# Patient Record
Sex: Male | Born: 1935 | Race: White | Hispanic: No | Smoking: Former smoker
Health system: Southern US, Community
[De-identification: ages and names within clinical notes are randomized; demographics above are authoritative.]

## PROBLEM LIST (undated history)

## (undated) DIAGNOSIS — I10 Essential (primary) hypertension: Secondary | ICD-10-CM

## (undated) HISTORY — PX: NASAL SEPTUM SURGERY: SHX37

## (undated) HISTORY — PX: KIDNEY STONE SURGERY: SHX686

## (undated) HISTORY — PX: TONSILLECTOMY: SUR1361

---

## 2015-02-23 DIAGNOSIS — M170 Bilateral primary osteoarthritis of knee: Secondary | ICD-10-CM | POA: Diagnosis not present

## 2015-02-23 DIAGNOSIS — Z6825 Body mass index (BMI) 25.0-25.9, adult: Secondary | ICD-10-CM | POA: Diagnosis not present

## 2015-02-23 DIAGNOSIS — I10 Essential (primary) hypertension: Secondary | ICD-10-CM | POA: Diagnosis not present

## 2015-05-30 DIAGNOSIS — Z1389 Encounter for screening for other disorder: Secondary | ICD-10-CM | POA: Diagnosis not present

## 2015-05-30 DIAGNOSIS — Z6825 Body mass index (BMI) 25.0-25.9, adult: Secondary | ICD-10-CM | POA: Diagnosis not present

## 2015-05-30 DIAGNOSIS — Z0000 Encounter for general adult medical examination without abnormal findings: Secondary | ICD-10-CM | POA: Diagnosis not present

## 2015-05-30 DIAGNOSIS — Z79899 Other long term (current) drug therapy: Secondary | ICD-10-CM | POA: Diagnosis not present

## 2015-05-30 DIAGNOSIS — I10 Essential (primary) hypertension: Secondary | ICD-10-CM | POA: Diagnosis not present

## 2015-05-30 DIAGNOSIS — G3184 Mild cognitive impairment, so stated: Secondary | ICD-10-CM | POA: Diagnosis not present

## 2015-08-15 DIAGNOSIS — J3089 Other allergic rhinitis: Secondary | ICD-10-CM | POA: Diagnosis not present

## 2015-08-15 DIAGNOSIS — G3184 Mild cognitive impairment, so stated: Secondary | ICD-10-CM | POA: Diagnosis not present

## 2015-08-15 DIAGNOSIS — Z6825 Body mass index (BMI) 25.0-25.9, adult: Secondary | ICD-10-CM | POA: Diagnosis not present

## 2015-08-15 DIAGNOSIS — I10 Essential (primary) hypertension: Secondary | ICD-10-CM | POA: Diagnosis not present

## 2015-08-15 DIAGNOSIS — L02415 Cutaneous abscess of right lower limb: Secondary | ICD-10-CM | POA: Diagnosis not present

## 2015-11-16 DIAGNOSIS — I10 Essential (primary) hypertension: Secondary | ICD-10-CM | POA: Diagnosis not present

## 2015-11-16 DIAGNOSIS — J3089 Other allergic rhinitis: Secondary | ICD-10-CM | POA: Diagnosis not present

## 2015-11-16 DIAGNOSIS — G3184 Mild cognitive impairment, so stated: Secondary | ICD-10-CM | POA: Diagnosis not present

## 2015-11-16 DIAGNOSIS — Z6825 Body mass index (BMI) 25.0-25.9, adult: Secondary | ICD-10-CM | POA: Diagnosis not present

## 2016-06-04 DIAGNOSIS — Z0000 Encounter for general adult medical examination without abnormal findings: Secondary | ICD-10-CM | POA: Diagnosis not present

## 2016-06-04 DIAGNOSIS — G3184 Mild cognitive impairment, so stated: Secondary | ICD-10-CM | POA: Diagnosis not present

## 2016-06-04 DIAGNOSIS — Z1389 Encounter for screening for other disorder: Secondary | ICD-10-CM | POA: Diagnosis not present

## 2016-06-04 DIAGNOSIS — I10 Essential (primary) hypertension: Secondary | ICD-10-CM | POA: Diagnosis not present

## 2016-06-04 DIAGNOSIS — E784 Other hyperlipidemia: Secondary | ICD-10-CM | POA: Diagnosis not present

## 2016-08-05 DIAGNOSIS — E784 Other hyperlipidemia: Secondary | ICD-10-CM | POA: Diagnosis not present

## 2016-08-05 DIAGNOSIS — Z79899 Other long term (current) drug therapy: Secondary | ICD-10-CM | POA: Diagnosis not present

## 2016-08-05 DIAGNOSIS — E860 Dehydration: Secondary | ICD-10-CM | POA: Diagnosis not present

## 2016-08-05 DIAGNOSIS — A681 Tick-borne relapsing fever: Secondary | ICD-10-CM | POA: Diagnosis not present

## 2016-08-05 DIAGNOSIS — I10 Essential (primary) hypertension: Secondary | ICD-10-CM | POA: Diagnosis not present

## 2016-08-12 DIAGNOSIS — R1084 Generalized abdominal pain: Secondary | ICD-10-CM | POA: Diagnosis not present

## 2016-08-12 DIAGNOSIS — R932 Abnormal findings on diagnostic imaging of liver and biliary tract: Secondary | ICD-10-CM | POA: Diagnosis not present

## 2016-08-12 DIAGNOSIS — R161 Splenomegaly, not elsewhere classified: Secondary | ICD-10-CM | POA: Diagnosis not present

## 2016-08-12 DIAGNOSIS — Z87442 Personal history of urinary calculi: Secondary | ICD-10-CM | POA: Diagnosis not present

## 2016-08-12 DIAGNOSIS — R109 Unspecified abdominal pain: Secondary | ICD-10-CM | POA: Diagnosis not present

## 2016-09-03 DIAGNOSIS — M899 Disorder of bone, unspecified: Secondary | ICD-10-CM | POA: Diagnosis not present

## 2016-09-03 DIAGNOSIS — I728 Aneurysm of other specified arteries: Secondary | ICD-10-CM | POA: Diagnosis not present

## 2016-09-03 DIAGNOSIS — R1084 Generalized abdominal pain: Secondary | ICD-10-CM | POA: Diagnosis not present

## 2016-09-03 DIAGNOSIS — R161 Splenomegaly, not elsewhere classified: Secondary | ICD-10-CM | POA: Diagnosis not present

## 2016-09-03 DIAGNOSIS — R932 Abnormal findings on diagnostic imaging of liver and biliary tract: Secondary | ICD-10-CM | POA: Diagnosis not present

## 2016-09-12 DIAGNOSIS — N182 Chronic kidney disease, stage 2 (mild): Secondary | ICD-10-CM | POA: Diagnosis not present

## 2016-09-12 DIAGNOSIS — E782 Mixed hyperlipidemia: Secondary | ICD-10-CM | POA: Diagnosis not present

## 2016-09-12 DIAGNOSIS — J3089 Other allergic rhinitis: Secondary | ICD-10-CM | POA: Diagnosis not present

## 2016-09-12 DIAGNOSIS — J440 Chronic obstructive pulmonary disease with acute lower respiratory infection: Secondary | ICD-10-CM | POA: Diagnosis not present

## 2016-09-12 DIAGNOSIS — Z6825 Body mass index (BMI) 25.0-25.9, adult: Secondary | ICD-10-CM | POA: Diagnosis not present

## 2016-12-12 DIAGNOSIS — J3089 Other allergic rhinitis: Secondary | ICD-10-CM | POA: Diagnosis not present

## 2016-12-12 DIAGNOSIS — E782 Mixed hyperlipidemia: Secondary | ICD-10-CM | POA: Diagnosis not present

## 2016-12-12 DIAGNOSIS — Z6825 Body mass index (BMI) 25.0-25.9, adult: Secondary | ICD-10-CM | POA: Diagnosis not present

## 2016-12-12 DIAGNOSIS — J440 Chronic obstructive pulmonary disease with acute lower respiratory infection: Secondary | ICD-10-CM | POA: Diagnosis not present

## 2016-12-12 DIAGNOSIS — N182 Chronic kidney disease, stage 2 (mild): Secondary | ICD-10-CM | POA: Diagnosis not present

## 2017-10-10 DIAGNOSIS — M164 Bilateral post-traumatic osteoarthritis of hip: Secondary | ICD-10-CM | POA: Diagnosis not present

## 2017-10-10 DIAGNOSIS — I10 Essential (primary) hypertension: Secondary | ICD-10-CM | POA: Diagnosis not present

## 2017-10-10 DIAGNOSIS — L570 Actinic keratosis: Secondary | ICD-10-CM | POA: Diagnosis not present

## 2017-10-10 DIAGNOSIS — E782 Mixed hyperlipidemia: Secondary | ICD-10-CM | POA: Diagnosis not present

## 2017-10-10 DIAGNOSIS — N182 Chronic kidney disease, stage 2 (mild): Secondary | ICD-10-CM | POA: Diagnosis not present

## 2017-10-10 DIAGNOSIS — Z6822 Body mass index (BMI) 22.0-22.9, adult: Secondary | ICD-10-CM | POA: Diagnosis not present

## 2018-01-14 DIAGNOSIS — Z1389 Encounter for screening for other disorder: Secondary | ICD-10-CM | POA: Diagnosis not present

## 2018-01-14 DIAGNOSIS — E782 Mixed hyperlipidemia: Secondary | ICD-10-CM | POA: Diagnosis not present

## 2018-01-14 DIAGNOSIS — N182 Chronic kidney disease, stage 2 (mild): Secondary | ICD-10-CM | POA: Diagnosis not present

## 2018-01-14 DIAGNOSIS — M164 Bilateral post-traumatic osteoarthritis of hip: Secondary | ICD-10-CM | POA: Diagnosis not present

## 2018-01-14 DIAGNOSIS — I129 Hypertensive chronic kidney disease with stage 1 through stage 4 chronic kidney disease, or unspecified chronic kidney disease: Secondary | ICD-10-CM | POA: Diagnosis not present

## 2018-01-14 DIAGNOSIS — Z6824 Body mass index (BMI) 24.0-24.9, adult: Secondary | ICD-10-CM | POA: Diagnosis not present

## 2018-01-14 DIAGNOSIS — Z0000 Encounter for general adult medical examination without abnormal findings: Secondary | ICD-10-CM | POA: Diagnosis not present

## 2018-04-09 DIAGNOSIS — M164 Bilateral post-traumatic osteoarthritis of hip: Secondary | ICD-10-CM | POA: Diagnosis not present

## 2018-04-09 DIAGNOSIS — I10 Essential (primary) hypertension: Secondary | ICD-10-CM | POA: Diagnosis not present

## 2018-04-09 DIAGNOSIS — E782 Mixed hyperlipidemia: Secondary | ICD-10-CM | POA: Diagnosis not present

## 2018-04-09 DIAGNOSIS — N182 Chronic kidney disease, stage 2 (mild): Secondary | ICD-10-CM | POA: Diagnosis not present

## 2018-06-26 DIAGNOSIS — S68114A Complete traumatic metacarpophalangeal amputation of right ring finger, initial encounter: Secondary | ICD-10-CM | POA: Diagnosis not present

## 2018-06-26 DIAGNOSIS — I10 Essential (primary) hypertension: Secondary | ICD-10-CM | POA: Diagnosis not present

## 2018-06-26 DIAGNOSIS — W290XXA Contact with powered kitchen appliance, initial encounter: Secondary | ICD-10-CM | POA: Diagnosis not present

## 2018-06-26 DIAGNOSIS — S68624A Partial traumatic transphalangeal amputation of right ring finger, initial encounter: Secondary | ICD-10-CM | POA: Diagnosis not present

## 2018-06-26 DIAGNOSIS — Z79899 Other long term (current) drug therapy: Secondary | ICD-10-CM | POA: Diagnosis not present

## 2018-06-26 DIAGNOSIS — Z7982 Long term (current) use of aspirin: Secondary | ICD-10-CM | POA: Diagnosis not present

## 2018-06-26 DIAGNOSIS — Z880 Allergy status to penicillin: Secondary | ICD-10-CM | POA: Diagnosis not present

## 2018-06-26 DIAGNOSIS — Z23 Encounter for immunization: Secondary | ICD-10-CM | POA: Diagnosis not present

## 2018-06-26 DIAGNOSIS — S68124A Partial traumatic metacarpophalangeal amputation of right ring finger, initial encounter: Secondary | ICD-10-CM | POA: Diagnosis not present

## 2018-07-16 DIAGNOSIS — E782 Mixed hyperlipidemia: Secondary | ICD-10-CM | POA: Diagnosis not present

## 2018-07-16 DIAGNOSIS — Z6825 Body mass index (BMI) 25.0-25.9, adult: Secondary | ICD-10-CM | POA: Diagnosis not present

## 2018-07-16 DIAGNOSIS — Z0000 Encounter for general adult medical examination without abnormal findings: Secondary | ICD-10-CM | POA: Diagnosis not present

## 2018-07-16 DIAGNOSIS — N182 Chronic kidney disease, stage 2 (mild): Secondary | ICD-10-CM | POA: Diagnosis not present

## 2018-07-16 DIAGNOSIS — I10 Essential (primary) hypertension: Secondary | ICD-10-CM | POA: Diagnosis not present

## 2018-07-16 DIAGNOSIS — M164 Bilateral post-traumatic osteoarthritis of hip: Secondary | ICD-10-CM | POA: Diagnosis not present

## 2018-11-16 DIAGNOSIS — J449 Chronic obstructive pulmonary disease, unspecified: Secondary | ICD-10-CM | POA: Diagnosis not present

## 2018-11-16 DIAGNOSIS — I10 Essential (primary) hypertension: Secondary | ICD-10-CM | POA: Diagnosis not present

## 2018-11-16 DIAGNOSIS — Z6824 Body mass index (BMI) 24.0-24.9, adult: Secondary | ICD-10-CM | POA: Diagnosis not present

## 2018-11-16 DIAGNOSIS — M164 Bilateral post-traumatic osteoarthritis of hip: Secondary | ICD-10-CM | POA: Diagnosis not present

## 2018-11-16 DIAGNOSIS — N182 Chronic kidney disease, stage 2 (mild): Secondary | ICD-10-CM | POA: Diagnosis not present

## 2018-11-16 DIAGNOSIS — E782 Mixed hyperlipidemia: Secondary | ICD-10-CM | POA: Diagnosis not present

## 2019-02-16 DIAGNOSIS — R0981 Nasal congestion: Secondary | ICD-10-CM | POA: Diagnosis not present

## 2019-02-16 DIAGNOSIS — Z9189 Other specified personal risk factors, not elsewhere classified: Secondary | ICD-10-CM | POA: Diagnosis not present

## 2019-02-16 DIAGNOSIS — R5383 Other fatigue: Secondary | ICD-10-CM | POA: Diagnosis not present

## 2019-03-22 DIAGNOSIS — M164 Bilateral post-traumatic osteoarthritis of hip: Secondary | ICD-10-CM | POA: Diagnosis not present

## 2019-03-22 DIAGNOSIS — Z0000 Encounter for general adult medical examination without abnormal findings: Secondary | ICD-10-CM | POA: Diagnosis not present

## 2019-03-22 DIAGNOSIS — E782 Mixed hyperlipidemia: Secondary | ICD-10-CM | POA: Diagnosis not present

## 2019-03-22 DIAGNOSIS — Z1389 Encounter for screening for other disorder: Secondary | ICD-10-CM | POA: Diagnosis not present

## 2019-03-22 DIAGNOSIS — Z6824 Body mass index (BMI) 24.0-24.9, adult: Secondary | ICD-10-CM | POA: Diagnosis not present

## 2019-03-22 DIAGNOSIS — I10 Essential (primary) hypertension: Secondary | ICD-10-CM | POA: Diagnosis not present

## 2019-03-22 DIAGNOSIS — J449 Chronic obstructive pulmonary disease, unspecified: Secondary | ICD-10-CM | POA: Diagnosis not present

## 2019-03-22 DIAGNOSIS — N182 Chronic kidney disease, stage 2 (mild): Secondary | ICD-10-CM | POA: Diagnosis not present

## 2019-07-20 DIAGNOSIS — J449 Chronic obstructive pulmonary disease, unspecified: Secondary | ICD-10-CM | POA: Diagnosis not present

## 2019-07-20 DIAGNOSIS — N182 Chronic kidney disease, stage 2 (mild): Secondary | ICD-10-CM | POA: Diagnosis not present

## 2019-07-20 DIAGNOSIS — M164 Bilateral post-traumatic osteoarthritis of hip: Secondary | ICD-10-CM | POA: Diagnosis not present

## 2019-07-20 DIAGNOSIS — E782 Mixed hyperlipidemia: Secondary | ICD-10-CM | POA: Diagnosis not present

## 2019-07-20 DIAGNOSIS — Z6824 Body mass index (BMI) 24.0-24.9, adult: Secondary | ICD-10-CM | POA: Diagnosis not present

## 2019-07-20 DIAGNOSIS — I10 Essential (primary) hypertension: Secondary | ICD-10-CM | POA: Diagnosis not present

## 2019-11-16 DIAGNOSIS — E782 Mixed hyperlipidemia: Secondary | ICD-10-CM | POA: Diagnosis not present

## 2019-11-16 DIAGNOSIS — Z0000 Encounter for general adult medical examination without abnormal findings: Secondary | ICD-10-CM | POA: Diagnosis not present

## 2019-11-16 DIAGNOSIS — M164 Bilateral post-traumatic osteoarthritis of hip: Secondary | ICD-10-CM | POA: Diagnosis not present

## 2019-11-16 DIAGNOSIS — I10 Essential (primary) hypertension: Secondary | ICD-10-CM | POA: Diagnosis not present

## 2019-11-16 DIAGNOSIS — N182 Chronic kidney disease, stage 2 (mild): Secondary | ICD-10-CM | POA: Diagnosis not present

## 2019-11-16 DIAGNOSIS — J449 Chronic obstructive pulmonary disease, unspecified: Secondary | ICD-10-CM | POA: Diagnosis not present

## 2019-11-16 DIAGNOSIS — Z6823 Body mass index (BMI) 23.0-23.9, adult: Secondary | ICD-10-CM | POA: Diagnosis not present

## 2020-03-20 DIAGNOSIS — M164 Bilateral post-traumatic osteoarthritis of hip: Secondary | ICD-10-CM | POA: Diagnosis not present

## 2020-03-20 DIAGNOSIS — I10 Essential (primary) hypertension: Secondary | ICD-10-CM | POA: Diagnosis not present

## 2020-03-20 DIAGNOSIS — E782 Mixed hyperlipidemia: Secondary | ICD-10-CM | POA: Diagnosis not present

## 2020-03-20 DIAGNOSIS — J449 Chronic obstructive pulmonary disease, unspecified: Secondary | ICD-10-CM | POA: Diagnosis not present

## 2020-03-20 DIAGNOSIS — Z6823 Body mass index (BMI) 23.0-23.9, adult: Secondary | ICD-10-CM | POA: Diagnosis not present

## 2020-03-20 DIAGNOSIS — N182 Chronic kidney disease, stage 2 (mild): Secondary | ICD-10-CM | POA: Diagnosis not present

## 2020-07-03 DIAGNOSIS — J449 Chronic obstructive pulmonary disease, unspecified: Secondary | ICD-10-CM | POA: Diagnosis not present

## 2020-07-03 DIAGNOSIS — N182 Chronic kidney disease, stage 2 (mild): Secondary | ICD-10-CM | POA: Diagnosis not present

## 2020-07-03 DIAGNOSIS — I10 Essential (primary) hypertension: Secondary | ICD-10-CM | POA: Diagnosis not present

## 2020-07-03 DIAGNOSIS — E782 Mixed hyperlipidemia: Secondary | ICD-10-CM | POA: Diagnosis not present

## 2020-07-17 DIAGNOSIS — Z6824 Body mass index (BMI) 24.0-24.9, adult: Secondary | ICD-10-CM | POA: Diagnosis not present

## 2020-07-17 DIAGNOSIS — N182 Chronic kidney disease, stage 2 (mild): Secondary | ICD-10-CM | POA: Diagnosis not present

## 2020-07-17 DIAGNOSIS — I10 Essential (primary) hypertension: Secondary | ICD-10-CM | POA: Diagnosis not present

## 2020-07-17 DIAGNOSIS — E782 Mixed hyperlipidemia: Secondary | ICD-10-CM | POA: Diagnosis not present

## 2020-07-17 DIAGNOSIS — Z0000 Encounter for general adult medical examination without abnormal findings: Secondary | ICD-10-CM | POA: Diagnosis not present

## 2020-07-17 DIAGNOSIS — J449 Chronic obstructive pulmonary disease, unspecified: Secondary | ICD-10-CM | POA: Diagnosis not present

## 2020-07-17 DIAGNOSIS — M164 Bilateral post-traumatic osteoarthritis of hip: Secondary | ICD-10-CM | POA: Diagnosis not present

## 2020-11-01 DIAGNOSIS — J449 Chronic obstructive pulmonary disease, unspecified: Secondary | ICD-10-CM | POA: Diagnosis not present

## 2020-11-01 DIAGNOSIS — H6123 Impacted cerumen, bilateral: Secondary | ICD-10-CM | POA: Diagnosis not present

## 2020-11-01 DIAGNOSIS — S81852A Open bite, left lower leg, initial encounter: Secondary | ICD-10-CM | POA: Diagnosis not present

## 2020-11-01 DIAGNOSIS — E782 Mixed hyperlipidemia: Secondary | ICD-10-CM | POA: Diagnosis not present

## 2020-11-01 DIAGNOSIS — N182 Chronic kidney disease, stage 2 (mild): Secondary | ICD-10-CM | POA: Diagnosis not present

## 2020-11-01 DIAGNOSIS — I10 Essential (primary) hypertension: Secondary | ICD-10-CM | POA: Diagnosis not present

## 2020-11-01 DIAGNOSIS — M164 Bilateral post-traumatic osteoarthritis of hip: Secondary | ICD-10-CM | POA: Diagnosis not present

## 2020-11-01 DIAGNOSIS — Z6825 Body mass index (BMI) 25.0-25.9, adult: Secondary | ICD-10-CM | POA: Diagnosis not present

## 2021-02-26 DIAGNOSIS — E782 Mixed hyperlipidemia: Secondary | ICD-10-CM | POA: Diagnosis not present

## 2021-02-26 DIAGNOSIS — J449 Chronic obstructive pulmonary disease, unspecified: Secondary | ICD-10-CM | POA: Diagnosis not present

## 2021-02-26 DIAGNOSIS — R0602 Shortness of breath: Secondary | ICD-10-CM | POA: Diagnosis not present

## 2021-02-26 DIAGNOSIS — I10 Essential (primary) hypertension: Secondary | ICD-10-CM | POA: Diagnosis not present

## 2021-02-26 DIAGNOSIS — N182 Chronic kidney disease, stage 2 (mild): Secondary | ICD-10-CM | POA: Diagnosis not present

## 2021-02-26 DIAGNOSIS — J40 Bronchitis, not specified as acute or chronic: Secondary | ICD-10-CM | POA: Diagnosis not present

## 2021-02-26 DIAGNOSIS — Z79899 Other long term (current) drug therapy: Secondary | ICD-10-CM | POA: Diagnosis not present

## 2021-02-26 DIAGNOSIS — I5021 Acute systolic (congestive) heart failure: Secondary | ICD-10-CM | POA: Diagnosis not present

## 2021-02-26 DIAGNOSIS — M164 Bilateral post-traumatic osteoarthritis of hip: Secondary | ICD-10-CM | POA: Diagnosis not present

## 2021-02-26 DIAGNOSIS — Z6825 Body mass index (BMI) 25.0-25.9, adult: Secondary | ICD-10-CM | POA: Diagnosis not present

## 2021-05-23 ENCOUNTER — Inpatient Hospital Stay (HOSPITAL_COMMUNITY): Payer: Medicare PPO

## 2021-05-23 ENCOUNTER — Encounter (HOSPITAL_COMMUNITY): Payer: Self-pay | Admitting: Emergency Medicine

## 2021-05-23 ENCOUNTER — Inpatient Hospital Stay (HOSPITAL_COMMUNITY)
Admission: EM | Admit: 2021-05-23 | Discharge: 2021-05-27 | DRG: 291 | Disposition: A | Discharge status: Home Health | Payer: Medicare PPO | Attending: Internal Medicine | Admitting: Internal Medicine

## 2021-05-23 ENCOUNTER — Emergency Department (HOSPITAL_COMMUNITY): Payer: Medicare PPO

## 2021-05-23 ENCOUNTER — Other Ambulatory Visit: Payer: Self-pay

## 2021-05-23 DIAGNOSIS — D539 Nutritional anemia, unspecified: Secondary | ICD-10-CM | POA: Diagnosis present

## 2021-05-23 DIAGNOSIS — D638 Anemia in other chronic diseases classified elsewhere: Secondary | ICD-10-CM | POA: Diagnosis not present

## 2021-05-23 DIAGNOSIS — J9601 Acute respiratory failure with hypoxia: Secondary | ICD-10-CM | POA: Diagnosis not present

## 2021-05-23 DIAGNOSIS — D509 Iron deficiency anemia, unspecified: Secondary | ICD-10-CM | POA: Diagnosis present

## 2021-05-23 DIAGNOSIS — Z9181 History of falling: Secondary | ICD-10-CM | POA: Diagnosis not present

## 2021-05-23 DIAGNOSIS — Z6827 Body mass index (BMI) 27.0-27.9, adult: Secondary | ICD-10-CM | POA: Diagnosis not present

## 2021-05-23 DIAGNOSIS — I5031 Acute diastolic (congestive) heart failure: Secondary | ICD-10-CM | POA: Diagnosis present

## 2021-05-23 DIAGNOSIS — Y929 Unspecified place or not applicable: Secondary | ICD-10-CM

## 2021-05-23 DIAGNOSIS — I050 Rheumatic mitral stenosis: Secondary | ICD-10-CM | POA: Diagnosis not present

## 2021-05-23 DIAGNOSIS — K449 Diaphragmatic hernia without obstruction or gangrene: Secondary | ICD-10-CM | POA: Diagnosis present

## 2021-05-23 DIAGNOSIS — J811 Chronic pulmonary edema: Secondary | ICD-10-CM | POA: Diagnosis not present

## 2021-05-23 DIAGNOSIS — Z79899 Other long term (current) drug therapy: Secondary | ICD-10-CM | POA: Diagnosis not present

## 2021-05-23 DIAGNOSIS — E538 Deficiency of other specified B group vitamins: Secondary | ICD-10-CM | POA: Diagnosis present

## 2021-05-23 DIAGNOSIS — I4510 Unspecified right bundle-branch block: Secondary | ICD-10-CM | POA: Diagnosis present

## 2021-05-23 DIAGNOSIS — K635 Polyp of colon: Secondary | ICD-10-CM | POA: Diagnosis not present

## 2021-05-23 DIAGNOSIS — K644 Residual hemorrhoidal skin tags: Secondary | ICD-10-CM | POA: Diagnosis not present

## 2021-05-23 DIAGNOSIS — R0602 Shortness of breath: Secondary | ICD-10-CM | POA: Diagnosis not present

## 2021-05-23 DIAGNOSIS — L539 Erythematous condition, unspecified: Secondary | ICD-10-CM | POA: Diagnosis present

## 2021-05-23 DIAGNOSIS — J90 Pleural effusion, not elsewhere classified: Secondary | ICD-10-CM | POA: Diagnosis not present

## 2021-05-23 DIAGNOSIS — D649 Anemia, unspecified: Secondary | ICD-10-CM

## 2021-05-23 DIAGNOSIS — Z7982 Long term (current) use of aspirin: Secondary | ICD-10-CM | POA: Diagnosis not present

## 2021-05-23 DIAGNOSIS — K624 Stenosis of anus and rectum: Secondary | ICD-10-CM | POA: Diagnosis present

## 2021-05-23 DIAGNOSIS — K2270 Barrett's esophagus without dysplasia: Secondary | ICD-10-CM | POA: Diagnosis not present

## 2021-05-23 DIAGNOSIS — J810 Acute pulmonary edema: Secondary | ICD-10-CM | POA: Diagnosis not present

## 2021-05-23 DIAGNOSIS — R000 Tachycardia, unspecified: Secondary | ICD-10-CM | POA: Diagnosis present

## 2021-05-23 DIAGNOSIS — I509 Heart failure, unspecified: Secondary | ICD-10-CM | POA: Diagnosis not present

## 2021-05-23 DIAGNOSIS — I4891 Unspecified atrial fibrillation: Secondary | ICD-10-CM | POA: Diagnosis not present

## 2021-05-23 DIAGNOSIS — I5041 Acute combined systolic (congestive) and diastolic (congestive) heart failure: Secondary | ICD-10-CM | POA: Diagnosis not present

## 2021-05-23 DIAGNOSIS — I110 Hypertensive heart disease with heart failure: Secondary | ICD-10-CM | POA: Diagnosis not present

## 2021-05-23 DIAGNOSIS — K2289 Other specified disease of esophagus: Secondary | ICD-10-CM | POA: Diagnosis not present

## 2021-05-23 DIAGNOSIS — K208 Other esophagitis without bleeding: Secondary | ICD-10-CM | POA: Diagnosis not present

## 2021-05-23 DIAGNOSIS — I10 Essential (primary) hypertension: Secondary | ICD-10-CM | POA: Diagnosis present

## 2021-05-23 DIAGNOSIS — H9190 Unspecified hearing loss, unspecified ear: Secondary | ICD-10-CM | POA: Diagnosis present

## 2021-05-23 DIAGNOSIS — I517 Cardiomegaly: Secondary | ICD-10-CM | POA: Diagnosis not present

## 2021-05-23 DIAGNOSIS — W57XXXA Bitten or stung by nonvenomous insect and other nonvenomous arthropods, initial encounter: Secondary | ICD-10-CM | POA: Diagnosis present

## 2021-05-23 DIAGNOSIS — R6510 Systemic inflammatory response syndrome (SIRS) of non-infectious origin without acute organ dysfunction: Secondary | ICD-10-CM | POA: Diagnosis not present

## 2021-05-23 DIAGNOSIS — D122 Benign neoplasm of ascending colon: Secondary | ICD-10-CM | POA: Diagnosis not present

## 2021-05-23 HISTORY — DX: Essential (primary) hypertension: I10

## 2021-05-23 LAB — HEPATIC FUNCTION PANEL
ALT: 10 U/L (ref 0–44)
AST: 19 U/L (ref 15–41)
Albumin: 3.5 g/dL (ref 3.5–5.0)
Alkaline Phosphatase: 45 U/L (ref 38–126)
Bilirubin, Direct: 0.1 mg/dL (ref 0.0–0.2)
Indirect Bilirubin: 0.5 mg/dL (ref 0.3–0.9)
Total Bilirubin: 0.6 mg/dL (ref 0.3–1.2)
Total Protein: 6.8 g/dL (ref 6.5–8.1)

## 2021-05-23 LAB — CBC
HCT: 19.1 % — ABNORMAL LOW (ref 39.0–52.0)
Hemoglobin: 4.9 g/dL — CL (ref 13.0–17.0)
MCH: 17.3 pg — ABNORMAL LOW (ref 26.0–34.0)
MCHC: 25.7 g/dL — ABNORMAL LOW (ref 30.0–36.0)
MCV: 67.3 fL — ABNORMAL LOW (ref 80.0–100.0)
Platelets: 410 10*3/uL — ABNORMAL HIGH (ref 150–400)
RBC: 2.84 MIL/uL — ABNORMAL LOW (ref 4.22–5.81)
RDW: 17.3 % — ABNORMAL HIGH (ref 11.5–15.5)
WBC: 5 10*3/uL (ref 4.0–10.5)
nRBC: 0 % (ref 0.0–0.2)

## 2021-05-23 LAB — BASIC METABOLIC PANEL
Anion gap: 6 (ref 5–15)
BUN: 13 mg/dL (ref 8–23)
CO2: 22 mmol/L (ref 22–32)
Calcium: 7.8 mg/dL — ABNORMAL LOW (ref 8.9–10.3)
Chloride: 110 mmol/L (ref 98–111)
Creatinine, Ser: 1.34 mg/dL — ABNORMAL HIGH (ref 0.61–1.24)
GFR, Estimated: 53 mL/min — ABNORMAL LOW (ref >60)
Glucose, Bld: 134 mg/dL — ABNORMAL HIGH (ref 70–99)
Potassium: 3.6 mmol/L (ref 3.5–5.1)
Sodium: 138 mmol/L (ref 135–145)

## 2021-05-23 LAB — IRON AND TIBC
Iron: 11 ug/dL — ABNORMAL LOW (ref 45–182)
Saturation Ratios: 2 % — ABNORMAL LOW (ref 17.9–39.5)
TIBC: 491 ug/dL — ABNORMAL HIGH (ref 250–450)
UIBC: 480 ug/dL

## 2021-05-23 LAB — PROCALCITONIN: Procalcitonin: <0.1 ng/mL

## 2021-05-23 LAB — POC OCCULT BLOOD, ED: Fecal Occult Bld: NEGATIVE

## 2021-05-23 LAB — MAGNESIUM: Magnesium: 2.2 mg/dL (ref 1.7–2.4)

## 2021-05-23 LAB — ABO/RH: ABO/RH(D): A POS

## 2021-05-23 LAB — FOLATE: Folate: 12.4 ng/mL (ref >5.9)

## 2021-05-23 LAB — PREPARE RBC (CROSSMATCH)

## 2021-05-23 LAB — VITAMIN B12: Vitamin B-12: 165 pg/mL — ABNORMAL LOW (ref 180–914)

## 2021-05-23 LAB — BRAIN NATRIURETIC PEPTIDE: B Natriuretic Peptide: 1134 pg/mL — ABNORMAL HIGH (ref 0.0–100.0)

## 2021-05-23 LAB — FERRITIN: Ferritin: 4 ng/mL — ABNORMAL LOW (ref 24–336)

## 2021-05-23 MED ORDER — SIMETHICONE 80 MG PO CHEW
80.0000 mg | CHEWABLE_TABLET | Freq: Once | ORAL | Status: AC
Start: 2021-05-23 — End: 2021-05-23
  Administered 2021-05-23: 80 mg via ORAL
  Filled 2021-05-23: qty 1

## 2021-05-23 MED ORDER — FERROUS SULFATE 325 (65 FE) MG PO TABS
325.0000 mg | ORAL_TABLET | Freq: Every day | ORAL | Status: DC
  Administered 2021-05-24: 325 mg via ORAL
  Filled 2021-05-23: qty 1

## 2021-05-23 MED ORDER — PANTOPRAZOLE SODIUM 40 MG IV SOLR
40.0000 mg | INTRAVENOUS | Status: DC
  Administered 2021-05-23 – 2021-05-26 (×4): 40 mg via INTRAVENOUS
  Filled 2021-05-23 (×4): qty 10

## 2021-05-23 MED ORDER — FUROSEMIDE 10 MG/ML IJ SOLN
40.0000 mg | Freq: Two times a day (BID) | INTRAMUSCULAR | Status: DC
  Administered 2021-05-24 – 2021-05-26 (×6): 40 mg via INTRAVENOUS
  Filled 2021-05-23 (×6): qty 4

## 2021-05-23 MED ORDER — ACETAMINOPHEN 650 MG RE SUPP: 650.0000 mg | Freq: Four times a day (QID) | RECTAL | Status: DC | PRN

## 2021-05-23 MED ORDER — SODIUM CHLORIDE 0.9% IV SOLUTION
Freq: Once | INTRAVENOUS | Status: DC
Start: 2021-05-23 — End: 2021-05-27

## 2021-05-23 MED ORDER — POTASSIUM CHLORIDE CRYS ER 20 MEQ PO TBCR
40.0000 meq | EXTENDED_RELEASE_TABLET | Freq: Once | ORAL | Status: AC
  Administered 2021-05-23: 40 meq via ORAL
  Filled 2021-05-23: qty 2

## 2021-05-23 MED ORDER — FUROSEMIDE 10 MG/ML IJ SOLN
40.0000 mg | Freq: Once | INTRAMUSCULAR | Status: AC
  Administered 2021-05-23: 40 mg via INTRAVENOUS
  Filled 2021-05-23: qty 4

## 2021-05-23 MED ORDER — POLYETHYLENE GLYCOL 3350 17 G PO PACK: 17.0000 g | PACK | Freq: Every day | ORAL | Status: DC | PRN

## 2021-05-23 MED ORDER — BENAZEPRIL HCL 10 MG PO TABS
20.0000 mg | ORAL_TABLET | Freq: Every day | ORAL | Status: DC
Start: 2021-05-24 — End: 2021-05-27
  Administered 2021-05-24 – 2021-05-26 (×3): 20 mg via ORAL
  Filled 2021-05-23 (×3): qty 2

## 2021-05-23 MED ORDER — ACETAMINOPHEN 325 MG PO TABS: 650.0000 mg | ORAL_TABLET | Freq: Four times a day (QID) | ORAL | Status: DC | PRN

## 2021-05-23 NOTE — ED Provider Notes (Signed)
?[facility] ?Provider Note ? ? ?CSN: [id number] ?Arrival date & time: 05/23/21  1344 ? ?  ? ?History ? ?Chief Complaint  ?Patient presents with  ? Leg Swelling  ? ? ?Ricky [name] is a 86 y.o. male. ? ?HPI ? ?  ? ?Ricky [name] is a 86 y.o. male with past medical history of hypertension who presents to the Emergency Department at the request of PCP for evaluation of congestive heart failure.  Patient was seen at PCPs office earlier today with complaint of shortness of breath weight gain and peripheral edema.  Patient poor historian, son and daughter-in-law at bedside provide most of history.  Daughter-in-law states that he has 10 pound weight gain since January.  Family has noticed a grayish appearance in his skin and that he has had gradually increasing shortness of breath on exertion.  Patient states that he at times has to sit up in bed at night to breathe.  Admits that he gets winded if walking more than 30 to 40 feet.  He is unsure when the swelling in his legs began.  He denies any chest pain, cough, fever or chills.  No history of COPD or prior diagnosis of CHF.  He has not seen a cardiologist before.  He is a non-smoker. ? ?PCP Dr. Sherrie Sport, [facility] ? ?Home Medications ?Prior to Admission medications   ?Not on File  ?   ? ?Allergies    ?Penicillins   ? ?Review of Systems   ?Review of Systems  ?Constitutional:  Negative for chills and fever.  ?Respiratory:  Positive for shortness of breath. Negative for cough and wheezing.   ?Cardiovascular:  Positive for leg swelling. Negative for chest pain.  ?Gastrointestinal:  Negative for abdominal pain, nausea and vomiting.  ?Genitourinary:  Negative for dysuria.  ?Musculoskeletal:  Negative for arthralgias and back pain.  ?Skin:  Negative for rash and wound.  ?Neurological:  Negative for dizziness, weakness, light-headedness and numbness.  ?All other systems reviewed and are negative. ? ?Physical Exam ?Updated Vital Signs ?BP (!) 150/58   Pulse (!)  107   Temp 97.6 ?F (36.4 ?C) (Oral)   Resp (!) 21   Ht _0  (1.88 m)   Wt 86.2 kg   SpO2 100%   BMI 24.39 kg/m?  ?Physical Exam ?Vitals and nursing note reviewed. Exam conducted with a chaperone present.  ?Constitutional:   ?   Appearance: Normal appearance. He is not toxic-appearing.  ?HENT:  ?   Mouth/Throat:  ?   Mouth: Mucous membranes are moist.  ?Eyes:  ?   General: No scleral icterus. ?Cardiovascular:  ?   Rate and Rhythm: Regular rhythm. Tachycardia present.  ?   Pulses: Normal pulses.  ?Pulmonary:  ?   Effort: Pulmonary effort is normal. No respiratory distress.  ?   Breath sounds: No wheezing or rales.  ?Abdominal:  ?   General: There is no distension.  ?   Palpations: Abdomen is soft.  ?   Tenderness: There is no abdominal tenderness.  ?Genitourinary: ?   Rectum: Guaiac result negative. No tenderness. Normal anal tone.  ?   Comments: Brown, heme negative stool on digital rectal exam.  No palpable rectal masses. ?Musculoskeletal:  ?   Right lower leg: Edema present.  ?   Left lower leg: Edema present.  ?   Comments: Patient with 3+ pitting edema bilateral lower extremities extends to the level of the bilateral knee.  ?Skin: ?   General: Skin is  warm.  ?   Capillary Refill: Capillary refill takes less than 2 seconds.  ?   Findings: No erythema or rash.  ?Neurological:  ?   General: No focal deficit present.  ?   Mental Status: He is alert.  ?   Sensory: No sensory deficit.  ?   Motor: No weakness.  ? ? ?ED Results / Procedures / Treatments   ?Labs ?(all labs ordered are listed, but only abnormal results are displayed) ?Labs Reviewed  ?BASIC METABOLIC PANEL - Abnormal; Notable for the following components:  ?    Result Value  ? Glucose, Bld 134 (*)   ? Creatinine, Ser 1.34 (*)   ? Calcium 7.8 (*)   ? GFR, Estimated 53 (*)   ? All other components within normal limits  ?BRAIN NATRIURETIC PEPTIDE - Abnormal; Notable for the following components:  ? B Natriuretic Peptide 1,134.0 (*)   ? All other  components within normal limits  ?CBC - Abnormal; Notable for the following components:  ? RBC 2.84 (*)   ? Hemoglobin 4.9 (*)   ? HCT 19.1 (*)   ? MCV 67.3 (*)   ? MCH 17.3 (*)   ? MCHC 25.7 (*)   ? RDW 17.3 (*)   ? Platelets 410 (*)   ? All other components within normal limits  ?MAGNESIUM  ?POC OCCULT BLOOD, ED  ?POC OCCULT BLOOD, ED  ?TYPE AND SCREEN  ?PREPARE RBC (CROSSMATCH)  ?ABO/RH  ? ? ?EKG ?EKG Interpretation ? ?Date/Time:  Wednesday May 23 2021 13:59:33 EDT ?Ventricular Rate:  115 ?PR Interval:  146 ?QRS Duration: 134 ?QT Interval:  352 ?QTC Calculation: 487 ?R Axis:   59 ?Text Interpretation: Sinus tachycardia Right bundle branch block No old tracing to compare Confirmed by Noemi Chapel [phone]) on 05/23/2021 2:28:09 PM ? ?Radiology ?DG Chest Portable 1 View ? ?Result Date: 05/23/2021 ?CLINICAL DATA:  Short of breath, dyspnea on exertion EXAM: PORTABLE CHEST 1 VIEW COMPARISON:  None. FINDINGS: Single frontal view of the chest demonstrates mild enlargement the cardiac silhouette. Veiling opacity left lung base consistent with consolidation and/or small effusion. Central vascular congestion. No pneumothorax. IMPRESSION: 1. Left basilar consolidation and effusion, which could reflect pneumonia or atelectasis. 2. Central vascular congestion. 3. Borderline enlarged cardiac silhouette. Electronically Signed   By: Randa Ngo M.D.   On: 05/23/2021 15:19   ? ?Procedures ?Procedures  ? ? ?CRITICAL CARE ?Performed by: Vidhi Delellis ?Total critical care time: 40 minutes ?Critical care time was exclusive of separately billable procedures and treating other patients. ?Critical care was necessary to treat or prevent imminent or life-threatening deterioration. ?Critical care was time spent personally by me on the following activities: development of treatment plan with patient and/or surrogate as well as nursing, discussions with consultants, evaluation of patient's response to treatment, examination of patient,  obtaining history from patient or surrogate, ordering and performing treatments and interventions, ordering and review of laboratory studies, ordering and review of radiographic studies, pulse oximetry and re-evaluation of patient's condition. ? ? ?Medications Ordered in ED ?Medications - No data to display ? ?ED Course/ Medical Decision Making/ A&P ?  ?                        ? ? ?  05/23/2021  ?  4:36 PM 05/23/2021  ?  4:30 PM 05/23/2021  ?  4:00 PM  ?Vitals with BMI  ?Systolic 048  889  ?Diastolic 57  82  ?Pulse  96 93 104  ? ? ? ? ? ? ? ?Medical Decision Making ?Patient here from PCPs office for evaluation of possible CHF.  Patient endorses dyspnea on exertion for several weeks.  Reports 10 pound weight gain since January.  Peripheral edema of unknown onset.  No history of CHF per family but patient has intermittently taken Lasix 20 mg. ? ?On exam, patient has significant peripheral edema extending to the level of the knee.  No respiratory distress noted.  He is tachycardic, no hypoxia no supplemental oxygen requirement.  His lungs are clear on exam, I suspect this is congestive heart failure.  Will obtain labs, EKG, chest x-ray.  He will likely need diuresis and hospital admission. ? ?Amount and/or Complexity of Data Reviewed ?External Data Reviewed: notes. ?   Details: No prior medical records available for comparison.  No information available in care everywhere ? ?Family had most recent labs from PCPs office faxed over, labs from 02/26/2021 showing c-Met, lipid panel and pro-BNP documented at 322. image stored in medical record. ?Labs: ordered. ?   Details: Labs today interpreted by me, hemoglobin critically low at 4.9, hematocrit 19.1.  No labs available for comparison.  Fecal Hemoccult negative, BNP elevated at 1134.  Magnesium level unremarkable, chemistries show blood glucose of 134 serum creatinine 1.34 BUN 13 ?Radiology: ordered. ?   Details: Chest x-ray shows left basilar consolidation and effusion with  central vascular congestion and borderline enlarged cardiac silhouette. ?Discussion of management or test interpretation with external provider(s): Patient here with dyspnea on exertion, peripheral edema, I feel this is

## 2021-05-23 NOTE — ED Notes (Signed)
ED Provider at bedside. 

## 2021-05-23 NOTE — ED Notes (Signed)
Start blood at 1715; VS recheck at 1730. Pt states "I feel fine" at this time. This RN will continue to monitor ?

## 2021-05-23 NOTE — Assessment & Plan Note (Signed)
Systolic 0000000 to [id number]. ?-Resume benazepril in a.m. ?

## 2021-05-23 NOTE — Assessment & Plan Note (Addendum)
Symptomatic anemia, ?Acuity.  No prior records. No evidence of GI blood loss, NSAID use.  But anemia panel suggest iron deficiency with low ferritin of 4, low serum iron of 11, increased TIBC of 491, low iron saturation of 2.  Mildly low B12 of 165. ?-N.p.o. midnight ?-GI consult ?-Start oral iron supplementation, would benefit from iron infusion prior to discharge ?-IV Protonix 40 daily ?

## 2021-05-23 NOTE — ED Triage Notes (Signed)
Pt endorses bilateral leg swelling for a few weeks, SOB when walking. Pt states he has fluid pills but does not take them daily.  ?

## 2021-05-23 NOTE — H&P (Addendum)
?History and Physical  ? ? ?[name] [id number] DOB: 10/12/1935 DOA: 05/23/2021 ? ?PCP: Neale Burly, MD  ? ?Patient coming from: Home ? ?I have personally briefly reviewed patient's old medical records in [place] ? ?Chief Complaint: Difficulty swallowing ? ?HPI: Ricky [name] is a 86 y.o. male with medical history significant for HTN, hard of hearing. ?Daughter-in-law to a 2nd son not present and son help with the history, patient is very hard of hearing.  Patient was brought to the ED with reports of 10 pound weight gain since January, leg swelling, difficulty breathing when walking.  Family noticed a grayish appearance to his skin also.  ?Reports occasional difficulty breathing at night making him sit up to breathe.  Reports productive cough for the past 2 weeks.  No Chest pain. ?He denies black stools, no blood in stools no vomiting of blood no abdominal pain.  He is on 81 mg of aspirin, otherwise he denies NSAID use.  Reports colonoscopy in the past about 10 years ago.  No EGDs. ?Patient was started on 20 mg Lasix in January, reports missing some doses. ? ?Family was able to obtain outpatient records, which does not have CBC, proBNP checked in January was 322.  CMP checked was unremarkable. ? ?ED Course: Temperature 97.6.  Heart rate 93-113.  Respiratory rate 17-24.  O2 sats greater than 92% on room air.  Blood pressure systolic 0000000 to [id number].  Potassium 3.6. ?BNP 1134.  Hemoglobin 4.9. ?Chest x-ray-left basilar consolidation and effusion could reflect pneumonia or atelectasis, central vascular congestion. ?2 units PRBC ordered for transfusion.  IV Lasix 40 mg x 1 given. ? ?Review of Systems: As per HPI all other systems reviewed and negative. ? ?Past Medical History:  ?Diagnosis Date  ? Hypertension   ? ? ?Allergies  ?Allergen Reactions  ? Penicillins   ? ? ?Family history of hypertension. ? ?Prior to Admission medications   ?Medication Sig Start Date End Date Taking? Authorizing Provider   ?aspirin EC 81 MG tablet Take 81 mg by mouth daily. Swallow whole.   Yes [provider]  ?benazepril (LOTENSIN) 20 MG tablet Take 20 mg by mouth daily. 05/16/21  Yes [provider]  ?Chlorpheniramine Maleate (ALLERGY PO) Take 10 mg by mouth daily.   Yes [provider]  ?furosemide (LASIX) 20 MG tablet Take 20 mg by mouth daily. 02/26/21  Yes [provider]  ?potassium chloride (KLOR-CON) 10 MEQ tablet Take 10 mEq by mouth daily.   Yes [provider]  ? ? ?Physical Exam: ?Vitals:  ? 05/23/21 1530 05/23/21 1600 05/23/21 1630 05/23/21 1636  ?BP: (!) 143/58 (!) 152/82  (!) 153/57  ?Pulse: 96 (!) 104 93 96  ?Resp: 19 (!) 24 17 18   ?Temp:      ?TempSrc:      ?SpO2: 100% 100% 100% 100%  ?Weight:      ?Height:      ? ? ?Constitutional: NAD, calm, comfortable, hard of hearing ?Vitals:  ? 05/23/21 1530 05/23/21 1600 05/23/21 1630 05/23/21 1636  ?BP: (!) 143/58 (!) 152/82  (!) 153/57  ?Pulse: 96 (!) 104 93 96  ?Resp: 19 (!) 24 17 18   ?Temp:      ?TempSrc:      ?SpO2: 100% 100% 100% 100%  ?Weight:      ?Height:      ? ?Eyes: PERRL, lids and conjunctivae normal ?ENMT: Mucous membranes are moist.  ?Neck: normal, supple, no masses, no thyromegaly ?Respiratory:  clear to auscultation bilaterally, no wheezing, no crackles. Normal respiratory effort. No accessory muscle use.  ?Cardiovascular: Regular rate and rhythm, no murmurs / rubs / gallops.  3+ pitting extremity edema to knees at least.  Extremities warm. ?Abdomen: no tenderness, no masses palpated. No hepatosplenomegaly. Bowel sounds positive.  ?Musculoskeletal: no clubbing / cyanosis. No joint deformity upper and lower extremities.  ?Skin: no rashes, lesions, ulcers. No induration ?Neurologic: No apparent cranial abnormality, moving extremities spontaneously. ?Psychiatric: Normal judgment and insight. Alert and oriented x 3. Normal mood.  ? ?Labs on Admission: I have personally reviewed following labs and imaging  studies ? ?CBC: ?Recent Labs  ?Lab 05/23/21 ?1440  ?WBC 5.0  ?HGB 4.9*  ?HCT 19.1*  ?MCV 67.3*  ?PLT 410*  ? ?Basic Metabolic Panel: ?Recent Labs  ?Lab 05/23/21 ?1440  ?NA 138  ?K 3.6  ?CL 110  ?CO2 22  ?GLUCOSE 134*  ?BUN 13  ?CREATININE 1.34*  ?CALCIUM 7.8*  ?MG 2.2  ? ?Radiological Exams on Admission: ?DG Chest Portable 1 View ? ?Result Date: 05/23/2021 ?CLINICAL DATA:  Short of breath, dyspnea on exertion EXAM: PORTABLE CHEST 1 VIEW COMPARISON:  None. FINDINGS: Single frontal view of the chest demonstrates mild enlargement the cardiac silhouette. Veiling opacity left lung base consistent with consolidation and/or small effusion. Central vascular congestion. No pneumothorax. IMPRESSION: 1. Left basilar consolidation and effusion, which could reflect pneumonia or atelectasis. 2. Central vascular congestion. 3. Borderline enlarged cardiac silhouette. Electronically Signed   By: Randa Ngo M.D.   On: 05/23/2021 15:19   ? ?EKG: Independently reviewed.  Sinus tachycardia rate 115.  QTc 487.  RBBB.  QRS 134.  No prior EKG to compare. ? ?Assessment/Plan ?Principal Problem: ?  Acute congestive heart failure ([place]) ?Active Problems: ?  HTN (hypertension) ?  Acute anemia ?  ?Assessment and Plan: ?* Acute congestive heart failure ([place]) ?New onset congestive heart failure type unspecified.  BNP markedly elevated at 1134.  Severe anemia hemoglobin of 4.9 likely contributing.  Not hypoxic. ?- Chest x-ray showing consolidation plus effusion-central vascular congestion, PNA versus atelectasis.  Reports a productive cough x 2 weeks, but he is afebrile without leukocytosis.   ?- Procalcitonin less than 0.1.  Antibiotics deferred for now, monitor,versus repeat chest imaging pending clinical course ?-Obtain echo cardiogram ?-IV Lasix 40 mg x 2, with transfusion of 2 units of blood ?-Strict input output, daily weights ?- Daily BMP ? ?Acute anemia ?Symptomatic anemia, ?Acuity.  No prior records. No evidence of GI blood loss, NSAID  use.  But anemia panel suggest iron deficiency with low ferritin of 4, low serum iron of 11, increased TIBC of 491, low iron saturation of 2.  Mildly low B12 of 165. ?-N.p.o. midnight ?-GI consult ?-Start oral iron supplementation, would benefit from iron infusion prior to discharge ?-IV Protonix 40 daily ? ?HTN (hypertension) ?Systolic 0000000 to [id number]. ?-Resume benazepril in a.m. ? ? ?DVT prophylaxis: SCDS ?Code Status: Full ?Family Communication: Son Merry Proud at bedside, daughter in law Maudie Mercury) married to another son also at bedside. Both are HCPOA. ?Disposition Plan: > 2 days ?Consults called: GI ?Admission status: Inpt step down ?I certify that at the point of admission it is my clinical judgment that the patient will require inpatient hospital care spanning beyond 2 midnights from the point of admission due to high intensity of service, high risk for further deterioration and high frequency of surveillance required. ? ? ?Bethena Roys MD ?[facility] ? ?05/23/2021, 7:26 PM  ? ? ?For  on call review [email or web address].  ?

## 2021-05-23 NOTE — Assessment & Plan Note (Addendum)
New onset congestive heart failure type unspecified.  BNP markedly elevated at 1134.  Severe anemia hemoglobin of 4.9 likely contributing.  Not hypoxic. ?- Chest x-ray showing consolidation plus effusion-central vascular congestion, PNA versus atelectasis.  Reports a productive cough x 2 weeks, but he is afebrile without leukocytosis.   ?- Procalcitonin less than 0.1.  Antibiotics deferred for now, monitor,versus repeat chest imaging pending clinical course ?-Obtain echo cardiogram ?-IV Lasix 40 mg x 2, with transfusion of 2 units of blood ?-Strict input output, daily weights ?- Daily BMP ?

## 2021-05-24 ENCOUNTER — Inpatient Hospital Stay (HOSPITAL_COMMUNITY): Payer: Medicare PPO

## 2021-05-24 ENCOUNTER — Encounter (HOSPITAL_COMMUNITY): Payer: Self-pay | Admitting: Internal Medicine

## 2021-05-24 DIAGNOSIS — J810 Acute pulmonary edema: Secondary | ICD-10-CM

## 2021-05-24 DIAGNOSIS — J9601 Acute respiratory failure with hypoxia: Secondary | ICD-10-CM | POA: Diagnosis not present

## 2021-05-24 DIAGNOSIS — I509 Heart failure, unspecified: Secondary | ICD-10-CM | POA: Diagnosis not present

## 2021-05-24 DIAGNOSIS — I10 Essential (primary) hypertension: Secondary | ICD-10-CM | POA: Diagnosis not present

## 2021-05-24 DIAGNOSIS — D649 Anemia, unspecified: Secondary | ICD-10-CM | POA: Diagnosis not present

## 2021-05-24 LAB — ECHOCARDIOGRAM COMPLETE
AR max vel: 2.37 cm2
AV Area VTI: 2.69 cm2
AV Area mean vel: 2.58 cm2
AV Mean grad: 6 mmHg
AV Peak grad: 12.1 mmHg
Ao pk vel: 1.74 m/s
Area-P 1/2: 5.2 cm2
Calc EF: 40.8 %
Height: 74 in
MV M vel: 4.89 m/s
MV Peak grad: 95.6 mmHg
MV VTI: 3.71 cm2
Radius: 0.5 cm
S' Lateral: 3.8 cm
Single Plane A2C EF: 40 %
Single Plane A4C EF: 44.1 %
Weight: 2998.26 oz

## 2021-05-24 LAB — URINALYSIS, COMPLETE (UACMP) WITH MICROSCOPIC
Bacteria, UA: NONE SEEN
Bilirubin Urine: NEGATIVE
Glucose, UA: NEGATIVE mg/dL
Ketones, ur: NEGATIVE mg/dL
Leukocytes,Ua: NEGATIVE
Nitrite: NEGATIVE
Protein, ur: NEGATIVE mg/dL
RBC / HPF: >50 RBC/hpf — ABNORMAL HIGH (ref 0–5)
Specific Gravity, Urine: 1.005 (ref 1.005–1.030)
pH: 6 (ref 5.0–8.0)

## 2021-05-24 LAB — BASIC METABOLIC PANEL
Anion gap: 6 (ref 5–15)
BUN: 17 mg/dL (ref 8–23)
CO2: 24 mmol/L (ref 22–32)
Calcium: 8.2 mg/dL — ABNORMAL LOW (ref 8.9–10.3)
Chloride: 109 mmol/L (ref 98–111)
Creatinine, Ser: 1.7 mg/dL — ABNORMAL HIGH (ref 0.61–1.24)
GFR, Estimated: 40 mL/min — ABNORMAL LOW (ref >60)
Glucose, Bld: 103 mg/dL — ABNORMAL HIGH (ref 70–99)
Potassium: 4.1 mmol/L (ref 3.5–5.1)
Sodium: 139 mmol/L (ref 135–145)

## 2021-05-24 LAB — BLOOD GAS, ARTERIAL
Acid-Base Excess: 2.4 mmol/L — ABNORMAL HIGH (ref 0.0–2.0)
Acid-base deficit: 9.8 mmol/L — ABNORMAL HIGH (ref 0.0–2.0)
Bicarbonate: 19 mmol/L — ABNORMAL LOW (ref 20.0–28.0)
Bicarbonate: 25 mmol/L (ref 20.0–28.0)
Drawn by: 21310
Drawn by: 27733
FIO2: 38 %
FIO2: 32 %
O2 Saturation: 83.2 %
O2 Saturation: 97.6 %
Patient temperature: 36.3
Patient temperature: 37.6
pCO2 arterial: 50 mmHg — ABNORMAL HIGH (ref 32–48)
pCO2 arterial: 33 mmHg (ref 32–48)
pH, Arterial: 7.18 — CL (ref 7.35–7.45)
pH, Arterial: 7.49 — ABNORMAL HIGH (ref 7.35–7.45)
pO2, Arterial: 53 mmHg — ABNORMAL LOW (ref 83–108)
pO2, Arterial: 122 mmHg — ABNORMAL HIGH (ref 83–108)

## 2021-05-24 LAB — CBC
HCT: 24.7 % — ABNORMAL LOW (ref 39.0–52.0)
Hemoglobin: 6.6 g/dL — CL (ref 13.0–17.0)
MCH: 18.6 pg — ABNORMAL LOW (ref 26.0–34.0)
MCHC: 26.7 g/dL — ABNORMAL LOW (ref 30.0–36.0)
MCV: 69.8 fL — ABNORMAL LOW (ref 80.0–100.0)
Platelets: 501 10*3/uL — ABNORMAL HIGH (ref 150–400)
RBC: 3.54 MIL/uL — ABNORMAL LOW (ref 4.22–5.81)
RDW: 19 % — ABNORMAL HIGH (ref 11.5–15.5)
WBC: 16.4 10*3/uL — ABNORMAL HIGH (ref 4.0–10.5)
nRBC: 0.1 % (ref 0.0–0.2)

## 2021-05-24 LAB — TRANSFUSION REACTION
DAT C3: NEGATIVE
Post RXN DAT IgG: NEGATIVE

## 2021-05-24 LAB — RETICULOCYTES
Immature Retic Fract: 21.3 % — ABNORMAL HIGH (ref 2.3–15.9)
RBC.: 3.82 MIL/uL — ABNORMAL LOW (ref 4.22–5.81)
Retic Count, Absolute: 41.3 10*3/uL (ref 19.0–186.0)
Retic Ct Pct: 1.1 % (ref 0.4–3.1)

## 2021-05-24 LAB — HEMOGLOBIN AND HEMATOCRIT, BLOOD
HCT: 23.2 % — ABNORMAL LOW (ref 39.0–52.0)
Hemoglobin: 6.3 g/dL — CL (ref 13.0–17.0)

## 2021-05-24 LAB — POTASSIUM: Potassium: 4.2 mmol/L (ref 3.5–5.1)

## 2021-05-24 LAB — MRSA NEXT GEN BY PCR, NASAL: MRSA by PCR Next Gen: NOT DETECTED

## 2021-05-24 MED ORDER — FUROSEMIDE 10 MG/ML IJ SOLN
20.0000 mg | Freq: Once | INTRAMUSCULAR | Status: AC
  Administered 2021-05-24: 20 mg via INTRAVENOUS

## 2021-05-24 MED ORDER — FUROSEMIDE 10 MG/ML IJ SOLN
INTRAMUSCULAR | Status: AC
  Filled 2021-05-24: qty 2

## 2021-05-24 MED ORDER — CHLORHEXIDINE GLUCONATE CLOTH 2 % EX PADS
6.0000 | MEDICATED_PAD | Freq: Every day | CUTANEOUS | Status: DC
  Administered 2021-05-24 – 2021-05-27 (×4): 6 via TOPICAL

## 2021-05-24 NOTE — Progress Notes (Signed)
**Note De-Identified  Obfuscation** Patient removed from BIPAP and placed on 3 l ; tolerating well, VS WNL, SAT 100%. RRT to continue to monitor. ?

## 2021-05-24 NOTE — Progress Notes (Signed)
Called into room by nurse tech to observe patient audibly wheezing. Pt c/o S.O.B. Blood infusion stopped. Resp 32, O2: 97 on room air. 02 applied via [place] at 2liter to decrease work of breathing. HOB elevated. Room temp was reduced. MD notified via [facility] page. See new orders. ?

## 2021-05-24 NOTE — Progress Notes (Signed)
Voicemail left for Ricky [name] to return call to hospita. ?

## 2021-05-24 NOTE — Progress Notes (Signed)
*  PRELIMINARY RESULTS* ?Echocardiogram ?2D Echocardiogram has been performed. ? ?Carolyne Fiscal ?05/24/2021, 9:25 AM ?

## 2021-05-24 NOTE — Progress Notes (Signed)
Dr. At bedside 

## 2021-05-24 NOTE — TOC Initial Note (Signed)
Transition of Care (TOC) - Initial/Assessment Note  ? ? ?Patient Details  ?Name: Ricky [name] ?MRN: [id number] ?Date of Birth: 10/14/1935 ? ?Transition of Care (TOC) CM/SW Contact:    ?Villa Herb, LCSWA ?Phone Number: ?05/24/2021, 9:28 AM ? ?Clinical Narrative:                 ?TOC consulted for possible HH needs. CSW spoke with Judeen Hammans pts DIL to complete assessment. [place] so family is around if needed. Pt is independent in completing his ADLs. Pt is able to provide his own transportation as needed. Pt has not had HH in the past, family would be agreeable if recommended by PT/MD. Pt does not use any DME in the home however pts DIL states they have any needed DME in case pt does need to use anything. TOC to follow for needs.  ? ?Expected Discharge Plan: Home w Home Health Services ?Barriers to Discharge: Continued Medical Work up ? ? ?Patient Goals and CMS Choice ?Patient states their goals for this hospitalization and ongoing recovery are:: return home ?CMS Medicare.gov Compare Post Acute Care list provided to:: Patient ?Choice offered to / list presented to : Patient, Adult Children ? ?Expected Discharge Plan and Services ?Expected Discharge Plan: Home w Home Health Services ?In-house Referral: Clinical Social Work ?  ?  ?Living arrangements for the past 2 months: Single Family Home ?                ?  ?  ?  ?  ?  ?  ?  ?  ?  ?  ? ?Prior Living Arrangements/Services ?Living arrangements for the past 2 months: Single Family Home ?Lives with:: Self ?Patient language and need for interpreter reviewed:: Yes ?Do you feel safe going back to the place where you live?: Yes      ?Need for Family Participation in Patient Care: Yes (Comment) ?Care giver support system in place?: Yes (comment) ?  ?Criminal Activity/Legal Involvement Pertinent to Current Situation/Hospitalization: No - Comment as needed ? ?Activities of Daily Living ?Home Assistive Devices/Equipment: Hearing aid ?ADL  Screening (condition at time of admission) ?Patient's cognitive ability adequate to safely complete daily activities?: Yes ?Is the patient deaf or have difficulty hearing?: Yes ?Does the patient have difficulty seeing, even when wearing glasses/contacts?: No ?Does the patient have difficulty concentrating, remembering, or making decisions?: No ?Patient able to express need for assistance with ADLs?: Yes ?Does the patient have difficulty dressing or bathing?: No ?Independently performs ADLs?: Yes (appropriate for developmental age) ?Does the patient have difficulty walking or climbing stairs?: Yes ?Weakness of Legs: Both ?Weakness of Arms/Hands: None ? ?Permission Sought/Granted ?  ?  ?   ?   ?   ?   ? ?Emotional Assessment ?Appearance:: Appears stated age ?Attitude/Demeanor/Rapport: Engaged ?Affect (typically observed): Accepting ?Orientation: : Oriented to Self, Oriented to Place, Oriented to  Time, Oriented to Situation ?Alcohol / Substance Use: Not Applicable ?Psych Involvement: No (comment) ? ?Admission diagnosis:  Acute congestive heart failure (HCC) [I50.9] ?Symptomatic anemia [D64.9] ?Acute congestive heart failure, unspecified heart failure type (HCC) [I50.9] ?Patient Active Problem List  ? Diagnosis Date Noted  ? Acute congestive heart failure (HCC) 05/23/2021  ? HTN (hypertension) 05/23/2021  ? Acute anemia 05/23/2021  ? ?PCP:  Toma Deiters, MD ?Pharmacy:   ?[facility] [address], [place] - [address] ?[address] ?[place] [address] ?Phone: [phone] Fax: [phone] ? ? ? ? ?  Social Determinants of Health (SDOH) Interventions ?  ? ?Readmission Risk Interventions ?   ? View : No data to display.  ?  ?  ?  ? ? ? ?

## 2021-05-24 NOTE — Progress Notes (Signed)
Patient placed on Bipap due to respiratory distress.  Patient posssibly having reaction to blood products received earlier.  BS are expiratory wheeze on right and coarse on left.  Rate in 30s.  After being put on, patient instantly felt like he could breathe better and stated, "that's better". ?

## 2021-05-24 NOTE — Progress Notes (Signed)
?PROGRESS NOTE ? ? ? ?Ricky [name]  ZOX:[id number] DOB: [birth year] DOA: 05/23/2021 ?PCP: Ricky [name], Ricky [name], Ricky [name] ? ? ?Brief Narrative:  ?Ricky [name] is [name] 86 y.o. male with medical history significant for HTN, hard of hearing. Patient was brought to the ED with reports of 10 pound weight gain since January, leg swelling, difficulty breathing when walking. Patient was started on 20 mg Lasix in January, reports missing some doses.  Patient admitted for presumed heart failure exacerbation with symptomatic anemia as below. ?  ?Assessment & Plan: ?  ?Principal Problem: ?  Acute congestive heart failure (HCC) ?Active Problems: ?  HTN (hypertension) ?  Acute anemia ? ?Acute hypoxic respiratory failure in the setting of presumed congestive heart failure (HCC) ?-Follow echocardiogram, denies previous diagnosis of heart failure or requirement to have echocardiogram before ?-BNP elevated, hemoglobin 4.9 remains moderately hypoxic at rest -worse with exertion ?-Right lower lobe effusion noted on imaging ?-Required BiPAP overnight for respiratory status but appears to be improving ? ?SIRS, without sepsis ?-Likely reactive secondary to above given acute hypoxia [name]-fib and leukocytosis which is likely reactive ?-Procalcitonin less than 0.10 ?- Procalcitonin less than 0.1.  Antibiotics deferred for now, monitor,versus repeat chest imaging pending clinical course ?-Obtain echo cardiogram ?-IV Lasix 40 mg x 2, with transfusion of 2 units of blood ?-Strict input output, daily weights ?- Daily BMP ?  ?Acute symptomatic anemia, likely chronic anemia of chronic disease ?-FOBT negative, iron panel remarkable for low iron ?-Continue transfusions as indicated ?-Currently holding transfusion despite hemoglobin less than 7 due to intolerance of transfusion overnight with worsening volume status.  Will need to continue aggressive diuresis and then subsequently transfuse as appropriate in the next 24 to 48 hours ?-GI consulted, subsequent FOBT  negative, likely no acute need for intervention or endoscopy at this time ?-Start oral iron supplementation, would benefit from iron infusion prior to discharge ?-IV Protonix 40 daily ?  ?HTN (hypertension) ?Currently well controlled on benazepril ?  ?DVT prophylaxis: SCDS ?Code Status: Full ?Family Communication: None present ? ?Status is: Inpatient ? ?Dispo: The patient is from: Home ?             Anticipated d/c is to: To be determined ?             Anticipated d/c date is: 48 to 72 hours ?             Patient currently not medically stable for discharge ? ?Consultants:  ?GI ? ?Procedures:  ?None ? ?Antimicrobials:  ?None ? ?Subjective: ?No acute issues or events overnight ? ?Objective: ?Vitals:  ? 05/24/21 0442 05/24/21 0500 05/24/21 0551 05/24/21 0556  ?BP: (!) 133/56  129/60   ?Pulse: 99  96   ?Resp: 19  19   ?Temp:      ?TempSrc:      ?SpO2:   100%   ?Weight:  84 kg  85 kg  ?Height:      ? ? ?Intake/Output Summary (Last 24 hours) at 05/24/2021 0654 ?Last data filed at 05/24/2021 0533 ?Gross per 24 hour  ?Intake 830 ml  ?Output 3500 ml  ?Net -2670 ml  ? ?Filed Weights  ? 05/23/21 2000 05/24/21 0500 05/24/21 0556  ?Weight: 83.6 kg 84 kg 85 kg  ? ? ?Examination: ? ?General exam: Appears calm and comfortable  ?Respiratory system: Clear to auscultation. Respiratory effort normal. ?Cardiovascular system: S1 & S2 heard, RRR. No JVD, murmurs, rubs, gallops or clicks. No pedal edema. ?Gastrointestinal system: Abdomen  is nondistended, soft and nontender. No organomegaly or masses felt. Normal bowel sounds heard. ?Central nervous system: Alert and oriented. No focal neurological deficits. ?Extremities: Symmetric 5 x 5 power. ?Skin: No rashes, lesions or ulcers ?Psychiatry: Judgement and insight appear normal. Mood & affect appropriate.  ? ? ? ?Data Reviewed: I have personally reviewed following labs and imaging studies ? ?CBC: ?Recent Labs  ?Lab 05/23/21 ?1440 05/24/21 ?0409 05/24/21 ?1287  ?WBC 5.0 16.4*  --   ?HGB 4.9*  6.6* 6.3*  ?HCT 19.1* 24.7* 23.2*  ?MCV 67.3* 69.8*  --   ?PLT 410* 501*  --   ? ?Basic Metabolic Panel: ?Recent Labs  ?Lab 05/23/21 ?1440 05/24/21 ?0128 05/24/21 ?0409  ?NA 138  --  139  ?K 3.6 4.2 4.1  ?CL 110  --  109  ?CO2 22  --  24  ?GLUCOSE 134*  --  103*  ?BUN 13  --  17  ?CREATININE 1.34*  --  1.70*  ?CALCIUM 7.8*  --  8.2*  ?MG 2.2  --   --   ? ?GFR: ?Estimated Creatinine Clearance: 38.3 mL/min ([name]) (by C-G formula based on SCr of 1.7 mg/dL (H)). ?Liver Function Tests: ?Recent Labs  ?Lab 05/23/21 ?1440  ?AST 19  ?ALT 10  ?ALKPHOS 45  ?BILITOT 0.6  ?PROT 6.8  ?ALBUMIN 3.5  ? ?No results for input(s): LIPASE, AMYLASE in the last 168 hours. ?No results for input(s): AMMONIA in the last 168 hours. ?Coagulation Profile: ?No results for input(s): INR, PROTIME in the last 168 hours. ?Cardiac Enzymes: ?No results for input(s): CKTOTAL, CKMB, CKMBINDEX, TROPONINI in the last 168 hours. ?BNP (last 3 results) ?No results for input(s): PROBNP in the last 8760 hours. ?HbA1C: ?No results for input(s): HGBA1C in the last 72 hours. ?CBG: ?No results for input(s): GLUCAP in the last 168 hours. ?Lipid Profile: ?No results for input(s): CHOL, HDL, LDLCALC, TRIG, CHOLHDL, LDLDIRECT in the last 72 hours. ?Thyroid Function Tests: ?No results for input(s): TSH, T4TOTAL, FREET4, T3FREE, THYROIDAB in the last 72 hours. ?Anemia Panel: ?Recent Labs  ?  05/23/21 ?1440 05/24/21 ?0128  ?[id number] 165*  --   ?FOLATE 12.4  --   ?FERRITIN 4*  --   ?TIBC 491*  --   ?IRON 11*  --   ?RETICCTPCT  --  1.1  ? ?Sepsis Labs: ?Recent Labs  ?Lab 05/23/21 ?1440  ?PROCALCITON <0.10  ? ? ?Recent Results (from the past 240 hour(s))  ?MRSA Next Gen by PCR, Nasal     Status: None  ? Collection Time: 05/24/21  2:05 AM  ? Specimen: Nasal Mucosa; Nasal Swab  ?Result Value Ref Range Status  ? MRSA by PCR Next Gen NOT DETECTED NOT DETECTED Final  ?  Comment: (NOTE) ?The GeneXpert MRSA Assay (FDA approved for NASAL specimens only), ?is one component of [name]  comprehensive MRSA colonization surveillance ?program. It is not intended to diagnose MRSA infection nor to guide ?or monitor treatment for MRSA infections. ?Test performance is not FDA approved in patients less than 2 years ?old. ?Performed at [facility], [address], [place], [place] [address] ?  ?  ? ? ? ? ? ?Radiology Studies: ?DG Chest Port 1 View ? ?Result Date: 05/24/2021 ?CLINICAL DATA:  Acute congestive heart failure EXAM: PORTABLE CHEST 1 VIEW COMPARISON:  05/23/2021 FINDINGS: Cardiac size is mildly enlarged. Moderate perihilar interstitial pulmonary infiltrate has progressed and small bilateral pleural effusions have now developed in keeping with changes of progressive cardiogenic failure. No pneumothorax. No  acute bone abnormality. IMPRESSION: Progressive, moderate cardiogenic failure with increasing perihilar interstitial pulmonary edema and development of small bilateral pleural effusions. Electronically Signed   By: Helyn Numbers M.D.   On: 05/24/2021 00:23  ? ?DG Chest Portable 1 View ? ?Result Date: 05/23/2021 ?CLINICAL DATA:  Short of breath, dyspnea on exertion EXAM: PORTABLE CHEST 1 VIEW COMPARISON:  None. FINDINGS: Single frontal view of the chest demonstrates mild enlargement the cardiac silhouette. Veiling opacity left lung base consistent with consolidation and/or small effusion. Central vascular congestion. No pneumothorax. IMPRESSION: 1. Left basilar consolidation and effusion, which could reflect pneumonia or atelectasis. 2. Central vascular congestion. 3. Borderline enlarged cardiac silhouette. Electronically Signed   By: Sharlet Salina M.D.   On: 05/23/2021 15:19   ? ? ? ? ? ?Scheduled Meds: ? sodium chloride   Intravenous Once  ? benazepril  20 mg Oral Daily  ? Chlorhexidine Gluconate Cloth  6 each Topical Daily  ? ferrous sulfate  325 mg Oral Q breakfast  ? furosemide      ? furosemide      ? furosemide  40 mg Intravenous BID  ? pantoprazole (PROTONIX) IV  40 mg Intravenous Q24H   ? ?Continuous Infusions: ? ? LOS: 1 day  ? ?Time spent: ? ?Azucena Fallen, DO ?[facility] ? ?If 7PM-7AM, please contact night-coverage ?[email or web address] ? ?05/24/2021, 6:54 AM   ? ? ? ?

## 2021-05-24 NOTE — Consult Note (Signed)
? ? ?Gastroenterology Consult  ? ?Referring Provider: No ref. provider found ?Primary Care Physician:  Ricky Burly, MD ?Primary Gastroenterologist:  previously unassigned, Dr. Gala [name] ? ?Patient ID: Ricky [name]; [id number]; 13-Oct-1935  ? ?Admit date: 05/23/2021 ? LOS: 1 day  ? ?Date of Consultation: 05/24/2021 ? ?Reason for Consultation: Hgb 4.9 ?  ?History of Present Illness  ? ?Ricky [name] is a 86 y.o. male with hypertension, hard of hearing presented to the emergency department with several week history of bilateral leg swelling, shortness of breath with ambulation, PCP advising that he go to the emergency department for evaluation.  ? ?Family reported 10 pound weight gain since January.  Family noticed that he had a grayish appearance in his skin, gradual worsening shortness of breath with exertion.  At times having to sit up in the bed to breathe. Patient able to give good history today, he is hard of hearing but communicates fine if you talk loud and slowly. He denies melena, brbpr, abdominal pain, constipation, diarrhea. No heartburn unless he eats something spicy or acidic. No dysphagia. Has good appetite.  Denies history of alcohol use on a regular basis.  Takes aspirin 81 mg daily but denies any other NSAID use.  No known history of heart disease.  Takes medication for hypertension. ? ?In the emergency department: Initially pulse 107, blood pressure 173/65, O2 sats 100% on room air.  Glucose 134, BUN 13, creatinine 1.34, BNP 1134, white blood cell count 5000, hemoglobin 4.9, hematocrit 19.1, MCV 67.3, platelets 410,000, B12 165, folate normal, iron 11, iron saturation is 2%, TIBC 491, ferritin 4, stool heme negative.  Chest x-ray revealed left basilar consolidation and effusion could represent pneumonia or atelectasis, central vascular congestion, borderline enlarged cardiac silhouette. ? ?This morning hemoglobin 6.3, creatinine 1.70 ? ?Overnight, while receiving first unit of packed red blood cells,  patient had increased shortness of breath, audible wheezing.  Initially respiratory rate of 32, O2 sats 97% on room air.  Transfusion was stopped, 2 L of oxygen applied.  Attending called, patient noted to have increased shortness of breath, audible coarse breath sounds at bedside without a stethoscope, patient was diaphoretic. Chest x-ray this morning showed progressive, moderate cardiogenic failure with increasing perihilar interstitial pulmonary edema and development of small bilateral pleural effusions.  IV Lasix 20 mg x 2 given.  Blood gas showed pH of 7.18, PCO2 50, PO2 53, bicarb 19, acid base deficit 9.8, O2 sats 83.2%.  Eventually placed on BiPAP, noted improvement in respiratory status initially.  This morning he is off BiPAP.  Much improved.  Continues to have some orthopnea. ? ?Posttransfusion DAT negative, DAT C3 negative, okay to transfuse additional blood units.  Patient received second unit. ? ?Echo results pending. ?  ?Prior to Admission medications   ?Medication Sig Start Date End Date Taking? Authorizing Provider  ?aspirin EC 81 MG tablet Take 81 mg by mouth daily. Swallow whole.   Yes [provider]  ?benazepril (LOTENSIN) 20 MG tablet Take 20 mg by mouth daily. 05/16/21  Yes [provider]  ?Chlorpheniramine Maleate (ALLERGY PO) Take 10 mg by mouth daily.   Yes [provider]  ?furosemide (LASIX) 20 MG tablet Take 20 mg by mouth daily. 02/26/21  Yes [provider]  ?potassium chloride (KLOR-CON) 10 MEQ tablet Take 10 mEq by mouth daily.   Yes [provider]  ? ? ?Current Facility-Administered Medications  ?Medication Dose Route Frequency Provider Last Rate Last Admin  ? 0.9 %  sodium chloride infusion (Manually program via Guardrails IV Fluids)   Intravenous Once Triplett, Tammy, PA-C      ? acetaminophen (TYLENOL) tablet 650 mg  650 mg Oral Q6H PRN Emokpae, Ejiroghene E, MD      ? Or  ? acetaminophen (TYLENOL) suppository 650 mg  650 mg Rectal Q6H  PRN Emokpae, Ejiroghene E, MD      ? benazepril (LOTENSIN) tablet 20 mg  20 mg Oral Daily Emokpae, Ejiroghene E, MD   20 mg at 05/24/21 0829  ? Chlorhexidine Gluconate Cloth 2 % PADS 6 each  6 each Topical Daily Adefeso, Oladapo, DO      ? ferrous sulfate tablet 325 mg  325 mg Oral Q breakfast Emokpae, Ejiroghene E, MD   325 mg at 05/24/21 0830  ? furosemide (LASIX) 10 MG/ML injection           ? furosemide (LASIX) 10 MG/ML injection           ? furosemide (LASIX) injection 40 mg  40 mg Intravenous BID Emokpae, Ejiroghene E, MD   40 mg at 05/24/21 0831  ? pantoprazole (PROTONIX) injection 40 mg  40 mg Intravenous Q24H Emokpae, Ejiroghene E, MD   40 mg at 05/23/21 2044  ? polyethylene glycol (MIRALAX / GLYCOLAX) packet 17 g  17 g Oral Daily PRN Emokpae, Ejiroghene E, MD      ? ? ?Allergies as of 05/23/2021 - Review Complete 05/23/2021  ?Allergen Reaction Noted  ? Penicillins  05/23/2021  ? ? ?Past Medical History:  ?Diagnosis Date  ? Hypertension   ? ?PSH: Hold off on oral ironTonsillectomy, nasal surgery, kidney stone extraction ? ?Family History  ?Problem Relation Age of Onset  ? Colon cancer Neg Hx   ? ? ?Social History  ? ?Socioeconomic History  ? Marital status: Widowed  ?  Spouse name: Not on file  ? Number of children: Not on file  ? Years of education: Not on file  ? Highest education level: Not on file  ?Occupational History  ? Not on file  ?Tobacco Use  ? Smoking status: Former  ?  Types: Cigarettes  ? Smokeless tobacco: Never  ?Substance and Sexual Activity  ? Alcohol use: Never  ? Drug use: Never  ? Sexual activity: Not on file  ?Other Topics Concern  ? Not on file  ?Social History Narrative  ? Not on file  ? ?Social Determinants of Health  ? ?Financial Resource Strain: Not on file  ?Food Insecurity: Not on file  ?Transportation Needs: Not on file  ?Physical Activity: Not on file  ?Stress: Not on file  ?Social Connections: Not on file  ?Intimate Partner Violence: Not on file  ? ?  ?Review of System:   ? ?General: Negative for anorexia, weight loss, fever, chills, fatigue, positive weakness. ?Eyes: Negative for vision changes.  ?ENT: Negative for hoarseness, difficulty swallowing , nasal congestion. ?CV: Negative for chest pain, angina, palpitations, positive dyspnea on exertion, positive peripheral edema.  Positive orthopnea ?Respiratory: Negative for dyspnea at rest, positive dyspnea on exertion, cough, sputum, wheezing.  ?GI: See history of present illness. ?GU:  Negative for dysuria, hematuria, urinary incontinence, urinary frequency, nocturnal urination.  ?MS: Negative for joint pain, low back pain.  ?Derm: Negative for rash or itching.  ?Neuro: Negative for weakness, abnormal sensation, seizure, frequent headaches, memory loss, confusion.  ?Psych: Negative for anxiety, depression, suicidal ideation, hallucinations.  ?Endo: Negative for unusual weight change.  ?Heme: Negative for bruising or bleeding. ?Allergy: Negative  for rash or hives. ?     ?Physical Examination:  ? ?Vital signs in last 24 hours: ?Temp:  [96.9 ?F (36.1 ?C)-99.8 ?F (37.7 ?C)] 99.8 ?F (37.7 ?C) (04/20 0815) ?Pulse Rate:  [79-131] 102 (04/20 0900) ?Resp:  [17-34] 21 (04/20 0900) ?BP: (122-173)/(44-99) 122/44 (04/20 0900) ?SpO2:  [92 %-100 %] 98 % (04/20 0900) ?FiO2 (%):  [46 %] 46 % (04/20 0800) ?Weight:  [83.6 kg-86.2 kg] 85 kg (04/20 0556) ?Last BM Date : 05/23/21 ? ?General: Elderly, hard of hearing male sitting upright in bed, no acute distress.  His son Merry Proud joins during end of visit. ?Head: Normocephalic, atraumatic.   ?Eyes: Conjunctiva pale, no icterus. ?Mouth: Oropharyngeal mucosa moist and pink, no lesions erythema or exudate. ?Neck: Supple without thyromegaly, masses, or lymphadenopathy.  ?Lungs: Clear to auscultation bilaterally.  ?Heart: Regular rate and rhythm, no murmurs rubs or gallops.  ?Abdomen: Bowel sounds are normal, nontender, nondistended, no hepatosplenomegaly or masses, no abdominal bruits or hernia , no rebound or  guarding.   ?Rectal: not performed ?Extremities: 2+pedal edema bilaterally.  ?Neuro: Alert and oriented x 4 , grossly normal neurologically.  ?Skin: Warm and dry, no rash or jaundice.   ?Psych: Alert and

## 2021-05-24 NOTE — Progress Notes (Addendum)
RN called due to patient having increased work of breathing and respiratory difficulty.  He was admitted earlier in the evening due to CHF, hemoglobin was 4.9, so 2 units of PRBC was ordered, patient's symptoms started while on second unit of PRBCs, this was stopped, however patient continues to have coarse breath sounds at bedside without a stethoscope and also was diaphoretic.   ?Chest x-ray was done and showed Progressive, moderate cardiogenic failure with increasing perihilar ?interstitial pulmonary edema and development of small bilateral pleural effusions. ?IV Lasix 20 mg x 2 was given. ?ABG was done and showed pH 7.18, PCO2 50, PO2 53, bicarb 19 at FiO2 of 38% ?Patient was provided with BiPAP for improved oxygen. ?ABG s/p BiPAP will be obtained. ? ? ?General: Elderly male. Awake and alert and oriented x3, diaphoretic and in acute distress.  ?HEENT: NCAT.  PERRLA. EOMI. Sclerae anicteric.  Moist mucosal membranes. ?Neck: Neck supple without lymphadenopathy. No carotid bruits. No masses palpated.  ?Cardiovascular: Regular rate with normal S1-S2 sounds. No murmurs, rubs or gallops auscultated. No JVD.  ?Respiratory: Bilateral rales in lower lobes on auscultation.  No accessory muscle use. ?Abdomen: Soft, nontender, nondistended. Active bowel sounds. No masses or hepatosplenomegaly  ?Skin: No rashes, lesions, or ulcerations.  Dry, warm to touch. ?Musculoskeletal:  2+ dorsalis pedis and radial pulses. Good ROM.  No contractures  ?Psychiatric: Intact judgment and insight.  Mood appropriate to current condition. ?Neurologic: No focal neurological deficits. Strength is 5/5 x 4.  CN II - XII grossly intact. ? ? ? ? ?Critical time: 53 minutes ? ? Critical care personally provided  managing the patient due to high probability of clinically significant and life threatening deterioration. This critical care time included obtaining a history; examining the patient, pulse oximetry; ordering and review of studies; arranging  urgent treatment with development of a management plan; evaluation of patient's response of treatment; frequent reassessment; and discussions with other providers.  This critical care time was performed to assess and manage the high probability of imminent and life threatening deterioration that could result in multi-organ failure. ? ?

## 2021-05-25 DIAGNOSIS — D649 Anemia, unspecified: Secondary | ICD-10-CM | POA: Diagnosis not present

## 2021-05-25 LAB — CBC
HCT: 22.4 % — ABNORMAL LOW (ref 39.0–52.0)
HCT: 30.4 % — ABNORMAL LOW (ref 39.0–52.0)
Hemoglobin: 5.6 g/dL — CL (ref 13.0–17.0)
Hemoglobin: 8.7 g/dL — ABNORMAL LOW (ref 13.0–17.0)
MCH: 18.1 pg — ABNORMAL LOW (ref 26.0–34.0)
MCH: 20.7 pg — ABNORMAL LOW (ref 26.0–34.0)
MCHC: 25 g/dL — ABNORMAL LOW (ref 30.0–36.0)
MCHC: 28.6 g/dL — ABNORMAL LOW (ref 30.0–36.0)
MCV: 72.3 fL — ABNORMAL LOW (ref 80.0–100.0)
MCV: 72.4 fL — ABNORMAL LOW (ref 80.0–100.0)
Platelets: 388 10*3/uL (ref 150–400)
Platelets: 434 10*3/uL — ABNORMAL HIGH (ref 150–400)
RBC: 3.1 MIL/uL — ABNORMAL LOW (ref 4.22–5.81)
RBC: 4.2 MIL/uL — ABNORMAL LOW (ref 4.22–5.81)
RDW: 19.7 % — ABNORMAL HIGH (ref 11.5–15.5)
RDW: 21.4 % — ABNORMAL HIGH (ref 11.5–15.5)
WBC: 5.8 10*3/uL (ref 4.0–10.5)
WBC: 9.8 10*3/uL (ref 4.0–10.5)
nRBC: 0 % (ref 0.0–0.2)
nRBC: 0 % (ref 0.0–0.2)

## 2021-05-25 LAB — BASIC METABOLIC PANEL
Anion gap: 4 — ABNORMAL LOW (ref 5–15)
BUN: 21 mg/dL (ref 8–23)
CO2: 29 mmol/L (ref 22–32)
Calcium: 8.1 mg/dL — ABNORMAL LOW (ref 8.9–10.3)
Chloride: 107 mmol/L (ref 98–111)
Creatinine, Ser: 1.62 mg/dL — ABNORMAL HIGH (ref 0.61–1.24)
GFR, Estimated: 42 mL/min — ABNORMAL LOW (ref >60)
Glucose, Bld: 98 mg/dL (ref 70–99)
Potassium: 4.1 mmol/L (ref 3.5–5.1)
Sodium: 140 mmol/L (ref 135–145)

## 2021-05-25 LAB — PREPARE RBC (CROSSMATCH)

## 2021-05-25 MED ORDER — BISACODYL 5 MG PO TBEC
5.0000 mg | DELAYED_RELEASE_TABLET | Freq: Once | ORAL | Status: AC
  Administered 2021-05-25: 5 mg via ORAL
  Filled 2021-05-25: qty 1

## 2021-05-25 MED ORDER — FUROSEMIDE 10 MG/ML IJ SOLN
20.0000 mg | Freq: Once | INTRAMUSCULAR | Status: AC
  Administered 2021-05-25: 20 mg via INTRAVENOUS
  Filled 2021-05-25: qty 2

## 2021-05-25 MED ORDER — SODIUM CHLORIDE 0.9% IV SOLUTION: Freq: Once | INTRAVENOUS | Status: DC

## 2021-05-25 MED ORDER — SODIUM CHLORIDE 0.9 % IV SOLN
125.0000 mg | Freq: Once | INTRAVENOUS | Status: AC
  Administered 2021-05-25: 125 mg via INTRAVENOUS
  Filled 2021-05-25: qty 10

## 2021-05-25 NOTE — Progress Notes (Signed)
? ?Gastroenterology Progress Note  ? ?Referring Provider: No ref. provider found ?Primary Care Physician:  Toma Deiters, MD ?Primary Gastroenterologist:  Dr.Rourk (previously unassigned) ? ?Patient ID: Ricky [name]; [id number]; Mar 01, 1935  ? ? ?Subjective  ? ?Doing well this morning. Walked around the unit without difficulty. On Room air. Denies shortness of breath. BM this morning without evidence of blood, no melena. Denies abdominal pain. No N/V. Tolerating clear liquids well. He stated he would love some eggs and bacon.  ? ? ?Objective  ? ?Vital signs in last 24 hours ?Temp:  [97.5 ?F (36.4 ?C)-99.6 ?F (37.6 ?C)] 97.5 ?F (36.4 ?C) (04/21 0700) ?Pulse Rate:  [75-107] 99 (04/21 0900) ?Resp:  [16-21] 20 (04/21 0900) ?BP: (109-140)/(38-60) 128/56 (04/21 0900) ?SpO2:  [95 %-100 %] 98 % (04/21 0900) ?Weight:  [81 kg] 81 kg (04/21 0400) ?Last BM Date : 05/23/21 ? ?Physical Exam ?General:   Alert and oriented, pleasant ?Head:  Normocephalic and atraumatic. ?Eyes:  No icterus, sclera clear. Conjuctiva pink.  ?Mouth:  Without lesions, mucosa pink and moist.  ?Heart:  S1, S2 present, no murmurs noted.  ?Lungs: Clear to auscultation bilaterally, without wheezing, rales, or rhonchi.  ?Abdomen:  Bowel sounds present, soft, non-tender, non-distended. No HSM or hernias noted. No rebound or guarding. No masses appreciated  ?Msk:  Symmetrical without gross deformities. Normal posture. ?Pulses:  Normal pulses noted. ?Extremities:  +2 pitting edema to BLE, pt reports improved from yesterday. Without clubbing. ?Neurologic:  Alert and  oriented x4;  grossly normal neurologically. ?Psych:  Alert and cooperative. Normal mood and affect. ? ?Intake/Output from previous day: ?04/20 0701 - 04/21 0700 ?In: 240 [P.O.:240] ?Out: 5325 [Urine:5325] ?Intake/Output this shift: ?Total I/O ?In: -  ?Out: 1800 [Urine:1800] ? ?Lab Results ? ?Recent Labs  ?  05/23/21 ?1440 05/24/21 ?0409 05/24/21 ?0608 05/25/21 ?0407  ?WBC 5.0 16.4*  --  5.8  ?HGB  4.9* 6.6* 6.3* 5.6*  ?HCT 19.1* 24.7* 23.2* 22.4*  ?PLT 410* 501*  --  388  ? ?BMET ?Recent Labs  ?  05/23/21 ?1440 05/24/21 ?0128 05/24/21 ?0409 05/25/21 ?0407  ?NA 138  --  139 140  ?K 3.6 4.2 4.1 4.1  ?CL 110  --  109 107  ?CO2 22  --  24 29  ?GLUCOSE 134*  --  103* 98  ?BUN 13  --  17 21  ?CREATININE 1.34*  --  1.70* 1.62*  ?CALCIUM 7.8*  --  8.2* 8.1*  ? ?LFT ?Recent Labs  ?  05/23/21 ?1440  ?PROT 6.8  ?ALBUMIN 3.5  ?AST 19  ?ALT 10  ?ALKPHOS 45  ?BILITOT 0.6  ?BILIDIR 0.1  ?IBILI 0.5  ? ?PT/INR ?No results for input(s): LABPROT, INR in the last 72 hours. ?Hepatitis Panel ?No results for input(s): HEPBSAG, HCVAB, HEPAIGM, HEPBIGM in the last 72 hours. ? ? ?Studies/Results ?DG Chest Port 1 View ? ?Result Date: 05/24/2021 ?CLINICAL DATA:  Acute congestive heart failure EXAM: PORTABLE CHEST 1 VIEW COMPARISON:  05/23/2021 FINDINGS: Cardiac size is mildly enlarged. Moderate perihilar interstitial pulmonary infiltrate has progressed and small bilateral pleural effusions have now developed in keeping with changes of progressive cardiogenic failure. No pneumothorax. No acute bone abnormality. IMPRESSION: Progressive, moderate cardiogenic failure with increasing perihilar interstitial pulmonary edema and development of small bilateral pleural effusions. Electronically Signed   By: Helyn Numbers M.D.   On: 05/24/2021 00:23  ? ?DG Chest Portable 1 View ? ?Result Date: 05/23/2021 ?CLINICAL DATA:  Short of breath, dyspnea on exertion  EXAM: PORTABLE CHEST 1 VIEW COMPARISON:  None. FINDINGS: Single frontal view of the chest demonstrates mild enlargement the cardiac silhouette. Veiling opacity left lung base consistent with consolidation and/or small effusion. Central vascular congestion. No pneumothorax. IMPRESSION: 1. Left basilar consolidation and effusion, which could reflect pneumonia or atelectasis. 2. Central vascular congestion. 3. Borderline enlarged cardiac silhouette. Electronically Signed   By: Sharlet Salina M.D.    On: 05/23/2021 15:19  ? ?ECHOCARDIOGRAM COMPLETE ? ?Result Date: 05/24/2021 ?   ECHOCARDIOGRAM REPORT   Patient Name:   Ricky [name] Date of Exam: 05/24/2021 Medical Rec #:  [id number]      Height:       74.0 in Accession #:    [id number]     Weight:       187.4 lb Date of Birth:  [birth year]      BSA:          2.114 m? Patient Age:    85 years       BP:           122/65 mmHg Patient Gender: M              HR:           104 bpm. [facility] Procedure: 2D Echo, Cardiac Doppler and Color Doppler Indications:    CHF  History:        Patient has no prior history of Echocardiogram examinations.                 CHF; Risk Factors:Hypertension.  Sonographer:    Mikki Harbor Referring Phys: [phone] Heloise Beecham EMOKPAE IMPRESSIONS  1. Left ventricular ejection fraction, by estimation, is 50%. The left ventricle has low normal function. The left ventricle has no regional wall motion abnormalities. There is moderate left ventricular hypertrophy. Left ventricular diastolic parameters  are indeterminate.  2. IVC is poorly visualzied. PASP is at least 46 mmHg, at least moderate pulmonary HTN. . Right ventricular systolic function is mildly reduced. The right ventricular size is moderately enlarged.  3. Left atrial size was severely dilated.  4. Moderate pleural effusion in the left lateral region.  5. The mitral valve is abnormal. Mild mitral valve regurgitation. Mild mitral stenosis.  6. The tricuspid valve is abnormal.  7. The aortic valve is tricuspid. There is moderate calcification of the aortic valve. There is moderate thickening of the aortic valve. Aortic valve regurgitation is not visualized. No aortic stenosis is present.  8. Aortic dilatation noted. There is mild dilatation of the ascending aorta, measuring 40 mm. FINDINGS  Left Ventricle: Left ventricular ejection fraction, by estimation, is 50%. The left ventricle has low normal function. The left ventricle has no regional wall motion abnormalities. The  left ventricular internal cavity size was normal in size. There is moderate left ventricular hypertrophy. Left ventricular diastolic parameters are indeterminate. Right Ventricle: IVC is poorly visualzied. PASP is at least 46 mmHg, at least moderate pulmonary HTN. The right ventricular size is moderately enlarged. No increase in right ventricular wall thickness. Right ventricular systolic function is mildly reduced. Left Atrium: Left atrial size was severely dilated. Right Atrium: Right atrial size was normal in size. Pericardium: There is no evidence of pericardial effusion. Mitral Valve: The mitral valve is abnormal. There is mild thickening of the mitral valve leaflet(s). There is mild calcification of the mitral valve leaflet(s). Mild mitral annular calcification. Mild mitral valve regurgitation. Mild mitral valve stenosis. MV peak gradient, 6.0 mmHg. The  mean mitral valve gradient is 3.0 mmHg. Tricuspid Valve: The tricuspid valve is abnormal. Tricuspid valve regurgitation is mild . No evidence of tricuspid stenosis. Aortic Valve: The aortic valve is tricuspid. There is moderate calcification of the aortic valve. There is moderate thickening of the aortic valve. There is moderate aortic valve annular calcification. Aortic valve regurgitation is not visualized. No aortic stenosis is present. Aortic valve mean gradient measures 6.0 mmHg. Aortic valve peak gradient measures 12.1 mmHg. Aortic valve area, by VTI measures 2.69 cm?. Pulmonic Valve: The pulmonic valve was not well visualized. Pulmonic valve regurgitation is not visualized. No evidence of pulmonic stenosis. Aorta: The aortic root is normal in size and structure and aortic dilatation noted. There is mild dilatation of the ascending aorta, measuring 40 mm. Venous: The inferior vena cava was not well visualized. IAS/Shunts: The interatrial septum was not well visualized. Additional Comments: There is a moderate pleural effusion in the left lateral region.   LEFT VENTRICLE PLAX 2D LVIDd:         5.10 cm      Diastology LVIDs:         3.80 cm      LV e' medial:    7.29 cm/s LV PW:         1.30 cm      LV E/e' medial:  14.8 LV IVS:        1.40 cm      LV e

## 2021-05-25 NOTE — Evaluation (Signed)
Physical Therapy Evaluation ?Patient Details ?Name: Ricky [name] ?MRN: [id number] ?DOB: [birth year] ?Today's Date: 05/25/2021 ? ?History of Present Illness ? Ricky [name] is a 86 y.o. male with medical history significant for HTN, hard of hearing.  Daughter-in-law to a 2nd son not present and son help with the history, patient is very hard of hearing.  Patient was brought to the ED with reports of 10 pound weight gain since January, leg swelling, difficulty breathing when walking.  Family noticed a grayish appearance to his skin also.   Reports occasional difficulty breathing at night making him sit up to breathe.  Reports productive cough for the past 2 weeks.  No Chest pain.  He denies black stools, no blood in stools no vomiting of blood no abdominal pain.  He is on 81 mg of aspirin, otherwise he denies NSAID use.  Reports colonoscopy in the past about 10 years ago.  No EGDs.  Patient was started on 20 mg Lasix in January, reports missing some doses. ?  ?Clinical Impression ? Patient functioning at baseline for functional mobility and gait demonstrating good return for ambulation in room and hallways without loss of balance.  Plan:  Patient discharged from physical therapy to care of nursing for ambulation daily as tolerated for length of stay.  ?   ?   ? ?Recommendations for follow up therapy are one component of a multi-disciplinary discharge planning process, led by the attending physician.  Recommendations may be updated based on patient status, additional functional criteria and insurance authorization. ? ?Follow Up Recommendations No PT follow up ? ?  ?Assistance Recommended at Discharge PRN  ?Patient can return home with the following ? Help with stairs or ramp for entrance ? ?  ?Equipment Recommendations None recommended by PT  ?Recommendations for Other Services ?    ?  ?Functional Status Assessment Patient has not had a recent decline in their functional status  ? ?  ?Precautions / Restrictions  Precautions ?Precautions: None ?Restrictions ?Weight Bearing Restrictions: No  ? ?  ? ?Mobility ? Bed Mobility ?Overal bed mobility: Modified Independent ?  ?  ?  ?  ?  ?  ?  ?  ? ?Transfers ?Overall transfer level: Modified independent ?  ?  ?  ?  ?  ?  ?  ?  ?  ?  ? ?Ambulation/Gait ?Ambulation/Gait assistance: Modified independent (Device/Increase time) ?Gait Distance (Feet): 100 Feet ?Assistive device: None ?Gait Pattern/deviations: WFL(Within Functional Limits) ?Gait velocity: near normal ?  ?  ?General Gait Details: grossly WFL demonstrating good return for ambulation in room and hallways without loss of balance ? ?Stairs ?  ?  ?  ?  ?  ? ?Wheelchair Mobility ?  ? ?Modified Rankin (Stroke Patients Only) ?  ? ?  ? ?Balance Overall balance assessment: No apparent balance deficits (not formally assessed) ?  ?  ?  ?  ?  ?  ?  ?  ?  ?  ?  ?  ?  ?  ?  ?  ?  ?  ?   ? ? ? ?Pertinent Vitals/Pain Pain Assessment ?Pain Assessment: No/denies pain  ? ? ?Home Living Family/patient expects to be discharged to:: Private residence ?Living Arrangements: Alone ?Available Help at Discharge: Family;Available 24 hours/day ?Type of Home: House ?Home Access: Ramped entrance ?  ?  ?  ?Home Layout: One level ?Home Equipment: Conservation officer, nature (2 wheels);Cane - single point;BSC/3in1;Grab bars - tub/shower ?   ?  ?Prior Function  Prior Level of Function : Independent/Modified Independent ?  ?  ?  ?  ?  ?  ?Mobility Comments: Community ambulator without AD, drives ?ADLs Comments: Independent ?  ? ? ?Hand Dominance  ?   ? ?  ?Extremity/Trunk Assessment  ? Upper Extremity Assessment ?Upper Extremity Assessment: Overall WFL for tasks assessed ?  ? ?Lower Extremity Assessment ?Lower Extremity Assessment: Overall WFL for tasks assessed ?  ? ?Cervical / Trunk Assessment ?Cervical / Trunk Assessment: Normal  ?Communication  ? Communication: No difficulties  ?Cognition Arousal/Alertness: Awake/alert ?Behavior During Therapy: [facility] for tasks  assessed/performed ?Overall Cognitive Status: Within Functional Limits for tasks assessed ?  ?  ?  ?  ?  ?  ?  ?  ?  ?  ?  ?  ?  ?  ?  ?  ?  ?  ?  ? ?  ?General Comments   ? ?  ?Exercises    ? ?Assessment/Plan  ?  ?PT Assessment Patient does not need any further PT services  ?PT Problem List   ? ?   ?  ?PT Treatment Interventions     ? ?PT Goals (Current goals can be found in the Care Plan section)  ?Acute Rehab PT Goals ?Patient Stated Goal: return home with family to assist ?PT Goal Formulation: With patient ?Time For Goal Achievement: 05/25/21 ?Potential to Achieve Goals: Good ? ?  ?Frequency   ?  ? ? ?Co-evaluation   ?  ?  ?  ?  ? ? ?  ?AM-PAC PT "6 Clicks" Mobility  ?Outcome Measure Help needed turning from your back to your side while in a flat bed without using bedrails?: None ?Help needed moving from lying on your back to sitting on the side of a flat bed without using bedrails?: None ?Help needed moving to and from a bed to a chair (including a wheelchair)?: None ?Help needed standing up from a chair using your arms (e.g., wheelchair or bedside chair)?: None ?Help needed to walk in hospital room?: None ?Help needed climbing 3-5 steps with a railing? : None ?6 Click Score: 24 ? ?  ?End of Session   ?Activity Tolerance: Patient tolerated treatment well ?Patient left: in chair;with call bell/phone within reach ?Nurse Communication: Mobility status ?PT Visit Diagnosis: Unsteadiness on feet (R26.81);Other abnormalities of gait and mobility (R26.89);Muscle weakness (generalized) (M62.81) ?  ? ?Time: [id number] ?PT Time Calculation (min) (ACUTE ONLY): 10 min ? ? ?Charges:   PT Evaluation ?$PT Eval Low Complexity: 1 Low ?PT Treatments ?$Therapeutic Activity: 8-22 mins ?  ?   ? ? ?11:58 AM, 05/25/21 ?Lonell Grandchild, MPT ?Physical Therapist with [place] ?[facility] ?[phone] office ?[phone] mobile phone ? ? ?

## 2021-05-25 NOTE — Progress Notes (Signed)
Patient did great off BIPAP all night. Unit pulled out of room. ?

## 2021-05-25 NOTE — Care Management Important Message (Signed)
Important Message ? ?Patient Details  ?Name: Ricky [name] ?MRN: [id number] ?Date of Birth: 08-09-1935 ? ? ?Medicare Important Message Given:  Yes ? ? ? ? ?Tommy Medal ?05/25/2021, 2:34 PM ?

## 2021-05-25 NOTE — Progress Notes (Signed)
?PROGRESS NOTE ? ? ? ?Ricky [name]  [id number] DOB: [birth year] DOA: 05/23/2021 ?PCP: Neale Burly, MD ? ? ?Brief Narrative:  ?Ricky [name] is a 86 y.o. male with medical history significant for HTN, hard of hearing. Patient was brought to the ED with reports of 10 pound weight gain since January, leg swelling, difficulty breathing when walking. Patient was started on 20 mg Lasix in January, reports missing some doses.  Patient admitted for presumed heart failure exacerbation with symptomatic anemia as below. ?  ?Assessment & Plan: ?  ?Principal Problem: ?  Acute congestive heart failure ([place]) ?Active Problems: ?  HTN (hypertension) ?  Acute anemia ? ?Acute symptomatic anemia, likely chronic anemia of chronic disease ?-FOBT negative, iron panel remarkable for low iron ?-Status post 1.5 unit at intake-(second unit stopped early due to patient's shortness of breath secondary to volume overload not transfusion reaction ) ?-2 additional units PRBC transfused today, monitor closely for volume overload  ?-Continue aggressive diuresis as above  ?-GI consulted, subsequent FOBT negative, defer to their expertise for possible need for endoscopy ?-Start oral iron supplementation, would benefit from iron infusion prior to discharge ?-IV Protonix 40 daily ? ?Acute hypoxic respiratory failure multifactorial  ?Presumed diastolic congestive heart failure ([place]) ?Symptomatic anemia ?-Echo shows EF of 50% low normal function without wall abnormalities, indeterminate diastolic function ?-BNP elevated, hemoglobin 4.9 remains moderately hypoxic at rest -worse with exertion ?-Anemia status post transfusion as below ?-Right lower lobe effusion noted on imaging concerning for volume overload ?-Required BiPAP overnight for respiratory status but appears to be improving ? ?SIRS, without sepsis ?-Likely reactive secondary to above given acute hypoxia A-fib and leukocytosis which is likely reactive ?-Continue to hold antibiotics,  remains without leukocytosis or overt infectious process ?-Obtain echo cardiogram ?-IV Lasix 40 mg x 2, with transfusion of 2 units of blood today, see below ?-Strict input output, daily weights ?- Daily BMP ?   ?HTN (hypertension) ?Currently well controlled on benazepril ?  ?DVT prophylaxis: SCDS ?Code Status: Full ?Family Communication: None present ? ?Status is: Inpatient ? ?Dispo: The patient is from: Home ?             Anticipated d/c is to: To be determined ?             Anticipated d/c date is: 48 to 72 hours ?             Patient currently not medically stable for discharge ? ?Consultants:  ?GI ? ?Procedures:  ?None ? ?Antimicrobials:  ?None ? ?Subjective: ?No acute issues or events overnight ? ?Objective: ?Vitals:  ? 05/25/21 0452 05/25/21 0500 05/25/21 0600 05/25/21 0700  ?BP:  (!) 128/46 (!) 109/38 (!) 124/48  ?Pulse:  85 81 84  ?Resp:  16 17 16   ?Temp:      ?TempSrc:      ?SpO2: 100% 97% 99% 100%  ?Weight:      ?Height:      ? ? ?Intake/Output Summary (Last 24 hours) at 05/25/2021 0809 ?Last data filed at 05/25/2021 0000 ?Gross per 24 hour  ?Intake 240 ml  ?Output 5325 ml  ?Net -5085 ml  ? ? ?Filed Weights  ? 05/24/21 0500 05/24/21 0556 05/25/21 0400  ?Weight: 84 kg 85 kg 81 kg  ? ? ?Examination: ? ?General exam: Appears calm and comfortable  ?Respiratory system: Clear to auscultation. Respiratory effort normal. ?Cardiovascular system: S1 & S2 heard, RRR. No JVD, murmurs, rubs, gallops or clicks. No pedal edema. ?Gastrointestinal  system: Abdomen is nondistended, soft and nontender. No organomegaly or masses felt. Normal bowel sounds heard. ?Central nervous system: Alert and oriented. No focal neurological deficits. ?Extremities: Symmetric 5 x 5 power. ?Skin: No rashes, lesions or ulcers ?Psychiatry: Judgement and insight appear normal. Mood & affect appropriate.  ? ?Data Reviewed: I have personally reviewed following labs and imaging studies ? ?CBC: ?Recent Labs  ?Lab 05/23/21 ?1440 05/24/21 ?0409  05/24/21 ?[id number] 05/25/21 ?0407  ?WBC 5.0 16.4*  --  5.8  ?HGB 4.9* 6.6* 6.3* 5.6*  ?HCT 19.1* 24.7* 23.2* 22.4*  ?MCV 67.3* 69.8*  --  72.3*  ?PLT 410* 501*  --  388  ? ?Basic Metabolic Panel: ?Recent Labs  ?Lab 05/23/21 ?1440 05/24/21 ?0128 05/24/21 ?0409 05/25/21 ?0407  ?NA 138  --  139 140  ?K 3.6 4.2 4.1 4.1  ?CL 110  --  109 107  ?CO2 22  --  24 29  ?GLUCOSE 134*  --  103* 98  ?BUN 13  --  17 21  ?CREATININE 1.34*  --  1.70* 1.62*  ?CALCIUM 7.8*  --  8.2* 8.1*  ?MG 2.2  --   --   --   ? ?GFR: ?Estimated Creatinine Clearance: 39.6 mL/min (A) (by C-G formula based on SCr of 1.62 mg/dL (H)). ? ?Liver Function Tests: ?Recent Labs  ?Lab 05/23/21 ?1440  ?AST 19  ?ALT 10  ?ALKPHOS 45  ?BILITOT 0.6  ?PROT 6.8  ?ALBUMIN 3.5  ? ? ?No results for input(s): LIPASE, AMYLASE in the last 168 hours. ?No results for input(s): AMMONIA in the last 168 hours. ?Coagulation Profile: ?No results for input(s): INR, PROTIME in the last 168 hours. ?Cardiac Enzymes: ?No results for input(s): CKTOTAL, CKMB, CKMBINDEX, TROPONINI in the last 168 hours. ?BNP (last 3 results) ?No results for input(s): PROBNP in the last 8760 hours. ?HbA1C: ?No results for input(s): HGBA1C in the last 72 hours. ?CBG: ?No results for input(s): GLUCAP in the last 168 hours. ?Lipid Profile: ?No results for input(s): CHOL, HDL, LDLCALC, TRIG, CHOLHDL, LDLDIRECT in the last 72 hours. ?Thyroid Function Tests: ?No results for input(s): TSH, T4TOTAL, FREET4, T3FREE, THYROIDAB in the last 72 hours. ?Anemia Panel: ?Recent Labs  ?  05/23/21 ?1440 05/24/21 ?0128  ?[id number]*  --   ?FOLATE 12.4  --   ?FERRITIN 4*  --   ?TIBC 491*  --   ?IRON 11*  --   ?RETICCTPCT  --  1.1  ? ? ?Sepsis Labs: ?Recent Labs  ?Lab 05/23/21 ?1440  ?PROCALCITON <0.10  ? ? ? ?Recent Results (from the past 240 hour(s))  ?MRSA Next Gen by PCR, Nasal     Status: None  ? Collection Time: 05/24/21  2:05 AM  ? Specimen: Nasal Mucosa; Nasal Swab  ?Result Value Ref Range Status  ? MRSA by PCR Next Gen  NOT DETECTED NOT DETECTED Final  ?  Comment: (NOTE) ?The GeneXpert MRSA Assay (FDA approved for NASAL specimens only), ?is one component of a comprehensive MRSA colonization surveillance ?program. It is not intended to diagnose MRSA infection nor to guide ?or monitor treatment for MRSA infections. ?Test performance is not FDA approved in patients less than 2 years ?old. ?Performed at [facility], [address], [place], [place] [address] ?  ? ? ?Radiology Studies: ?DG Chest Port 1 View ? ?Result Date: 05/24/2021 ?CLINICAL DATA:  Acute congestive heart failure EXAM: PORTABLE CHEST 1 VIEW COMPARISON:  05/23/2021 FINDINGS: Cardiac size is mildly enlarged. Moderate perihilar interstitial pulmonary infiltrate has progressed  and small bilateral pleural effusions have now developed in keeping with changes of progressive cardiogenic failure. No pneumothorax. No acute bone abnormality. IMPRESSION: Progressive, moderate cardiogenic failure with increasing perihilar interstitial pulmonary edema and development of small bilateral pleural effusions. Electronically Signed   By: Fidela Salisbury M.D.   On: 05/24/2021 00:23  ? ?DG Chest Portable 1 View ? ?Result Date: 05/23/2021 ?CLINICAL DATA:  Short of breath, dyspnea on exertion EXAM: PORTABLE CHEST 1 VIEW COMPARISON:  None. FINDINGS: Single frontal view of the chest demonstrates mild enlargement the cardiac silhouette. Veiling opacity left lung base consistent with consolidation and/or small effusion. Central vascular congestion. No pneumothorax. IMPRESSION: 1. Left basilar consolidation and effusion, which could reflect pneumonia or atelectasis. 2. Central vascular congestion. 3. Borderline enlarged cardiac silhouette. Electronically Signed   By: Randa Ngo M.D.   On: 05/23/2021 15:19  ? ?ECHOCARDIOGRAM COMPLETE ? ?Result Date: 05/24/2021 ?   ECHOCARDIOGRAM REPORT   Patient Name:   BIJAN DAUZAT Date of Exam: 05/24/2021 Medical Rec #:  [id number]      Height:       74.0 in  Accession #:    [id number]     Weight:       187.4 lb Date of Birth:  1935-09-29      BSA:          2.114 m? Patient Age:    43 years       BP:           122/65 mmHg Patient Gender: M              HR:           104 bpm

## 2021-05-26 DIAGNOSIS — D509 Iron deficiency anemia, unspecified: Secondary | ICD-10-CM

## 2021-05-26 DIAGNOSIS — D649 Anemia, unspecified: Secondary | ICD-10-CM | POA: Diagnosis not present

## 2021-05-26 LAB — CBC
HCT: 29 % — ABNORMAL LOW (ref 39.0–52.0)
Hemoglobin: 8.1 g/dL — ABNORMAL LOW (ref 13.0–17.0)
MCH: 20 pg — ABNORMAL LOW (ref 26.0–34.0)
MCHC: 27.9 g/dL — ABNORMAL LOW (ref 30.0–36.0)
MCV: 71.6 fL — ABNORMAL LOW (ref 80.0–100.0)
Platelets: 378 10*3/uL (ref 150–400)
RBC: 4.05 MIL/uL — ABNORMAL LOW (ref 4.22–5.81)
RDW: 21.7 % — ABNORMAL HIGH (ref 11.5–15.5)
WBC: 6.9 10*3/uL (ref 4.0–10.5)
nRBC: 0 % (ref 0.0–0.2)

## 2021-05-26 LAB — BASIC METABOLIC PANEL
Anion gap: 6 (ref 5–15)
BUN: 22 mg/dL (ref 8–23)
CO2: 30 mmol/L (ref 22–32)
Calcium: 7.8 mg/dL — ABNORMAL LOW (ref 8.9–10.3)
Chloride: 101 mmol/L (ref 98–111)
Creatinine, Ser: 1.69 mg/dL — ABNORMAL HIGH (ref 0.61–1.24)
GFR, Estimated: 40 mL/min — ABNORMAL LOW (ref >60)
Glucose, Bld: 95 mg/dL (ref 70–99)
Potassium: 3.7 mmol/L (ref 3.5–5.1)
Sodium: 137 mmol/L (ref 135–145)

## 2021-05-26 LAB — BRAIN NATRIURETIC PEPTIDE: B Natriuretic Peptide: 795 pg/mL — ABNORMAL HIGH (ref 0.0–100.0)

## 2021-05-26 MED ORDER — ORAL CARE MOUTH RINSE
15.0000 mL | Freq: Two times a day (BID) | OROMUCOSAL | Status: DC
  Administered 2021-05-26 – 2021-05-27 (×2): 15 mL via OROMUCOSAL

## 2021-05-26 MED ORDER — FOLIC ACID 1 MG PO TABS
1.0000 mg | ORAL_TABLET | Freq: Every day | ORAL | Status: DC
Start: 2021-05-26 — End: 2021-05-27
  Administered 2021-05-26 – 2021-05-27 (×2): 1 mg via ORAL
  Filled 2021-05-26 (×2): qty 1

## 2021-05-26 MED ORDER — PEG 3350-KCL-NA BICARB-NACL 420 G PO SOLR
4000.0000 mL | Freq: Once | ORAL | Status: AC
Start: 2021-05-26 — End: 2021-05-26
  Administered 2021-05-26: 4000 mL via ORAL

## 2021-05-26 MED ORDER — VITAMIN B-12 100 MCG PO TABS
250.0000 ug | ORAL_TABLET | Freq: Every day | ORAL | Status: DC
  Administered 2021-05-26: 250 ug via ORAL
  Filled 2021-05-26 (×3): qty 3

## 2021-05-26 MED ORDER — METOPROLOL TARTRATE 5 MG/5ML IV SOLN
2.5000 mg | Freq: Once | INTRAVENOUS | Status: AC
  Administered 2021-05-26: 2.5 mg via INTRAVENOUS
  Filled 2021-05-26: qty 5

## 2021-05-26 NOTE — Progress Notes (Signed)
20:24 EKG has been completed , later canceled by nurse. EKG in folder.  ?

## 2021-05-26 NOTE — Plan of Care (Signed)
Patient appears to have new onset Atrial Fibrillation with RVR. Resting HR 110s-130s, as high as 170s upon standing. No BP was documented from 0700 hrs this AM until this RN's assessment at ~ 1945 hrs this evening. Dr. Thomes Dinning evaluated the patient at the bedside tonight due to the AF. An order was received from Dr. Thomes Dinning and was executed by this RN to administer 2.5 mg IV metoprolol X 1. The metoprolol did lower the patient's HR slightly but also lowered his BP (see flowsheet). It was also noted earlier in the night that the patient's PIV site was raised, red, and painful to touch. The patient stated he had complained of the IV site "since yesterday". The existing PIV was removed and a new PIV  was placed without incident. Fall bundle was initiated by this RN (had not been previously addressed). Oral care bundle was initiated by this RN (had not been previously addressed). Furthermore, patient has completed his bowel prep and his stools appear to be clear. ? ? ?Problem: Education: ?Goal: Knowledge of General Education information will improve ?Description: Including pain rating scale, medication(s)/side effects and non-pharmacologic comfort measures ?Outcome: Progressing ?  ?Problem: Health Behavior/Discharge Planning: ?Goal: Ability to manage health-related needs will improve ?Outcome: Progressing ?  ?Problem: Clinical Measurements: ?Goal: Ability to maintain clinical measurements within normal limits will improve ?Outcome: Progressing ?Goal: Will remain free from infection ?Outcome: Progressing ?Goal: Diagnostic test results will improve ?Outcome: Progressing ?Goal: Respiratory complications will improve ?Outcome: Progressing ?Goal: Cardiovascular complication will be avoided ?Outcome: Progressing ?  ?Problem: Activity: ?Goal: Risk for activity intolerance will decrease ?Outcome: Progressing ?  ?Problem: Nutrition: ?Goal: Adequate nutrition will be maintained ?Outcome: Progressing ?  ?Problem: Coping: ?Goal:  Level of anxiety will decrease ?Outcome: Progressing ?  ?Problem: Elimination: ?Goal: Will not experience complications related to bowel motility ?Outcome: Progressing ?Goal: Will not experience complications related to urinary retention ?Outcome: Progressing ?  ?Problem: Pain Managment: ?Goal: General experience of comfort will improve ?Outcome: Progressing ?  ?Problem: Safety: ?Goal: Ability to remain free from injury will improve ?Outcome: Progressing ?  ?Problem: Skin Integrity: ?Goal: Risk for impaired skin integrity will decrease ?Outcome: Progressing ?  ?Problem: Education: ?Goal: Ability to demonstrate management of disease process will improve ?Outcome: Progressing ?Goal: Ability to verbalize understanding of medication therapies will improve ?Outcome: Progressing ?Goal: Individualized Educational Video(s) ?Outcome: Progressing ?  ?Problem: Activity: ?Goal: Capacity to carry out activities will improve ?Outcome: Progressing ?  ?Problem: Cardiac: ?Goal: Ability to achieve and maintain adequate cardiopulmonary perfusion will improve ?Outcome: Progressing ?  ?

## 2021-05-26 NOTE — Progress Notes (Signed)
?Subjective: ? ?Patient has no complaints.  He denies shortness of breath.  He also denies abdominal pain.  He tells me few months ago he had right flank pain lasting for about 6 hours.  He believes he had renal colic and passed a stone.  He has history of urolithiasis. ? ?Current Medications: ? ?Current Facility-Administered Medications:  ?  0.9 %  sodium chloride infusion (Manually program via Guardrails IV Fluids), , Intravenous, Once, Triplett, Tammy, PA-C ?  0.9 %  sodium chloride infusion (Manually program via Guardrails IV Fluids), , Intravenous, Once, Little Ishikawa, MD, Held at 05/25/21 1108 ?  acetaminophen (TYLENOL) tablet 650 mg, 650 mg, Oral, Q6H PRN **OR** acetaminophen (TYLENOL) suppository 650 mg, 650 mg, Rectal, Q6H PRN, Emokpae, Ejiroghene E, MD ?  benazepril (LOTENSIN) tablet 20 mg, 20 mg, Oral, Daily, Emokpae, Ejiroghene E, MD, 20 mg at 05/26/21 0945 ?  Chlorhexidine Gluconate Cloth 2 % PADS 6 each, 6 each, Topical, Daily, Adefeso, Oladapo, DO, 6 each at 05/25/21 0837 ?  folic acid (FOLVITE) tablet 1 mg, 1 mg, Oral, Daily, Little Ishikawa, MD, 1 mg at 05/26/21 0945 ?  furosemide (LASIX) injection 40 mg, 40 mg, Intravenous, BID, Emokpae, Ejiroghene E, MD, 40 mg at 05/26/21 6720 ?  pantoprazole (PROTONIX) injection 40 mg, 40 mg, Intravenous, Q24H, Emokpae, Ejiroghene E, MD, 40 mg at 05/25/21 2103 ?  polyethylene glycol (MIRALAX / GLYCOLAX) packet 17 g, 17 g, Oral, Daily PRN, Emokpae, Ejiroghene E, MD ?  vitamin B-12 (CYANOCOBALAMIN) tablet 250 mcg, 250 mcg, Oral, Daily, Little Ishikawa, MD, 250 mcg at 05/26/21 0945 ? ? ?Objective: ?Blood pressure 135/61, pulse (!) 115, temperature 97.7 ?F (36.5 ?C), temperature source Oral, resp. rate (!) 23, height 6' 2"  (1.88 m), weight 79 kg, SpO2 93 %. ?Patient is alert and in no acute distress. ?He has hearing impairment. ?Conjunctiva is pale.  Sclera is nonicteric. ?No neck masses or thyromegaly noted. ?Cardiac exam with regular rhythm normal  S1 and S2. No murmur or gallop noted. ?Lungs are clear to auscultation.  Auscultation lungs reveal few crackles at bases.  Abdomen is soft and nontender with organomegaly or masses. ?He has 2+ pitting edema to right leg and 1+ to left leg.  Edema has decreased since last seen. ? ?Labs/studies Results: ? ? ? ?  Latest Ref Rng & Units 05/26/2021  ?  4:34 AM 05/25/2021  ?  7:01 PM 05/25/2021  ?  4:07 AM  ?CBC  ?WBC 4.0 - 10.5 K/uL 6.9   9.8   5.8    ?Hemoglobin 13.0 - 17.0 g/dL 8.1   8.7   5.6    ?Hematocrit 39.0 - 52.0 % 29.0   30.4   22.4    ?Platelets 150 - 400 K/uL 378   434   388    ?  ? ?  Latest Ref Rng & Units 05/26/2021  ?  4:34 AM 05/25/2021  ?  4:07 AM 05/24/2021  ?  4:09 AM  ?CMP  ?Glucose 70 - 99 mg/dL 95   98   103    ?BUN 8 - 23 mg/dL 22   21   17     ?Creatinine 0.61 - 1.24 mg/dL 1.69   1.62   1.70    ?Sodium 135 - 145 mmol/L 137   140   139    ?Potassium 3.5 - 5.1 mmol/L 3.7   4.1   4.1    ?Chloride 98 - 111 mmol/L 101   107  109    ?CO2 22 - 32 mmol/L 30   29   24     ?Calcium 8.9 - 10.3 mg/dL 7.8   8.1   8.2    ?  ? ?  Latest Ref Rng & Units 05/23/2021  ?  2:40 PM  ?Hepatic Function  ?Total Protein 6.5 - 8.1 g/dL 6.8    ?Albumin 3.5 - 5.0 g/dL 3.5    ?AST 15 - 41 U/L 19    ?ALT 0 - 44 U/L 10    ?Alk Phosphatase 38 - 126 U/L 45    ?Total Bilirubin 0.3 - 1.2 mg/dL 0.6    ?Bilirubin, Direct 0.0 - 0.2 mg/dL 0.1    ?  ?BNP 795.  It was 1134 3 days ago. ? ?Assessment: ? ?#1.  Iron deficiency anemia.  Patient presented with hemoglobin of 4.9 g.  His stool was guaiac negative.  He has received 4 units of PRBCs.  Hemoglobin is now 8.1 g. ?Patient received 125 mg of ferric gluconate IV yesterday. ? ?#2.  Low B12 level.  Patient has been begun on oral B12 by Dr. Avon Gully. ? ?#3.  CHF.  CHF appears to be multifactorial.  He has diastolic dysfunction mitral stenosis profound anemia and fluid overloaded with transfusion.  There was concern for blood transfusion reaction but that was not the case.  He is improving with  diuretic therapy.  BNP is down to 795 from 1134 3 days ago. ? ? ?Recommendations ? ?Clear liquids today. ?Esophagogastroduodenoscopy and colonoscopy under monitored anesthesia care on 05/27/2021. ?Discontinue polyethylene glycol. ?GoLytely prep this afternoon. ? ? ? ? ? ?

## 2021-05-26 NOTE — Progress Notes (Signed)
?PROGRESS NOTE ? ? ? ?Ricky [name]  [id number] DOB: 1935-12-24 DOA: 05/23/2021 ?PCP: Neale Burly, MD ? ? ?Brief Narrative:  ?Ricky [name] is a 86 y.o. male with medical history significant for HTN, hard of hearing. Patient was brought to the ED with reports of 10 pound weight gain since January, leg swelling, difficulty breathing when walking. Patient was started on 20 mg Lasix in January, reports missing some doses.  Patient admitted for presumed heart failure exacerbation with symptomatic anemia as below. ?  ?Assessment & Plan: ?  ?Principal Problem: ?  Acute congestive heart failure ([place]) ?Active Problems: ?  HTN (hypertension) ?  Acute anemia ? ?Acute symptomatic anemia, likely chronic anemia of chronic disease ?-FOBT negative, iron panel remarkable for low iron ?-Status post 1.5 unit at intake-(second unit stopped early due to patient's shortness of breath secondary to volume overload not transfusion reaction ) ?-2 additional units PRBC transfused today, monitor closely for volume overload  ?-Continue aggressive diuresis as above  ?-GI consulted, subsequent FOBT negative, defer to their expertise for possible need for endoscopy ?-Start oral iron supplementation, IV iron completed yesterday, add B12 and folate per discussion with GI ?-IV Protonix 40 daily ? ?Acute hypoxic respiratory failure multifactorial  ?Presumed diastolic congestive heart failure ([place]) ?Symptomatic anemia ?-Echo shows EF of 50% low normal function without wall abnormalities, indeterminate diastolic function ?-BNP elevated, hemoglobin 4.9 remains moderately hypoxic at rest -worse with exertion ?-Anemia status post transfusion as below ?-Right lower lobe effusion noted on imaging concerning for volume overload ?-Required BiPAP overnight for respiratory status but appears to be improving ? ?SIRS, without sepsis ?-Likely reactive secondary to above given acute hypoxia A-fib and leukocytosis which is likely reactive ?-Continue to hold  antibiotics, remains without leukocytosis or overt infectious process ?-Obtain echo cardiogram ?-IV Lasix 40 mg x 2, with transfusion of 2 units of blood today, see below ?-Strict input output, daily weights ?- Daily BMP ?   ?HTN (hypertension) ?Currently well controlled on benazepril ?  ?DVT prophylaxis: SCDS ?Code Status: Full ?Family Communication: None present ? ?Status is: Inpatient ? ?Dispo: The patient is from: Home ?             Anticipated d/c is to: To be determined ?             Anticipated d/c date is: 48 to 72 hours ?             Patient currently not medically stable for discharge ? ?Consultants:  ?GI ? ?Procedures:  ?None ? ?Antimicrobials:  ?None ? ?Subjective: ?No acute issues or events overnight ? ?Objective: ?Vitals:  ? 05/26/21 0516 05/26/21 0600 05/26/21 0700 05/26/21 0735  ?BP:  (!) 138/56 135/61   ?Pulse: (!) 36 75 90 (!) 115  ?Resp: 19 16 (!) 21 (!) 23  ?Temp: 97.7 ?F (36.5 ?C)     ?TempSrc: Oral     ?SpO2: (!) 76% 96% 95% 93%  ?Weight:      ?Height:      ? ? ?Intake/Output Summary (Last 24 hours) at 05/26/2021 0750 ?Last data filed at 05/26/2021 0500 ?Gross per 24 hour  ?Intake 1289.96 ml  ?Output 5375 ml  ?Net -4085.04 ml  ? ? ?Filed Weights  ? 05/24/21 0556 05/25/21 0400 05/26/21 0444  ?Weight: 85 kg 81 kg 79 kg  ? ? ?Examination: ? ?General exam: Appears calm and comfortable  ?Respiratory system: Clear to auscultation. Respiratory effort normal. ?Cardiovascular system: S1 & S2 heard, RRR. No  JVD, murmurs, rubs, gallops or clicks. No pedal edema. ?Gastrointestinal system: Abdomen is nondistended, soft and nontender. No organomegaly or masses felt. Normal bowel sounds heard. ?Central nervous system: Alert and oriented. No focal neurological deficits. ?Extremities: Symmetric 5 x 5 power. ?Skin: No rashes, lesions or ulcers ?Psychiatry: Judgement and insight appear normal. Mood & affect appropriate.  ? ?Data Reviewed: I have personally reviewed following labs and imaging studies ? ?CBC: ?Recent  Labs  ?Lab 05/23/21 ?1440 05/24/21 ?0409 05/24/21 ?0608 05/25/21 ?0407 05/25/21 ?1901 05/26/21 ?0434  ?WBC 5.0 16.4*  --  5.8 9.8 6.9  ?HGB 4.9* 6.6* 6.3* 5.6* 8.7* 8.1*  ?HCT 19.1* 24.7* 23.2* 22.4* 30.4* 29.0*  ?MCV 67.3* 69.8*  --  72.3* 72.4* 71.6*  ?PLT 410* 501*  --  388 434* 378  ? ?Basic Metabolic Panel: ?Recent Labs  ?Lab 05/23/21 ?1440 05/24/21 ?0128 05/24/21 ?0409 05/25/21 ?0407 05/26/21 ?[id number]  ?NA 138  --  139 140 137  ?K 3.6 4.2 4.1 4.1 3.7  ?CL 110  --  109 107 101  ?CO2 22  --  24 29 30   ?GLUCOSE 134*  --  103* 98 95  ?BUN 13  --  17 21 22   ?CREATININE 1.34*  --  1.70* 1.62* 1.69*  ?CALCIUM 7.8*  --  8.2* 8.1* 7.8*  ?MG 2.2  --   --   --   --   ? ?GFR: ?Estimated Creatinine Clearance: 37 mL/min (A) (by C-G formula based on SCr of 1.69 mg/dL (H)). ? ?Liver Function Tests: ?Recent Labs  ?Lab 05/23/21 ?1440  ?AST 19  ?ALT 10  ?ALKPHOS 45  ?BILITOT 0.6  ?PROT 6.8  ?ALBUMIN 3.5  ? ? ?No results for input(s): LIPASE, AMYLASE in the last 168 hours. ?No results for input(s): AMMONIA in the last 168 hours. ?Coagulation Profile: ?No results for input(s): INR, PROTIME in the last 168 hours. ?Cardiac Enzymes: ?No results for input(s): CKTOTAL, CKMB, CKMBINDEX, TROPONINI in the last 168 hours. ?BNP (last 3 results) ?No results for input(s): PROBNP in the last 8760 hours. ?HbA1C: ?No results for input(s): HGBA1C in the last 72 hours. ?CBG: ?No results for input(s): GLUCAP in the last 168 hours. ?Lipid Profile: ?No results for input(s): CHOL, HDL, LDLCALC, TRIG, CHOLHDL, LDLDIRECT in the last 72 hours. ?Thyroid Function Tests: ?No results for input(s): TSH, T4TOTAL, FREET4, T3FREE, THYROIDAB in the last 72 hours. ?Anemia Panel: ?Recent Labs  ?  05/23/21 ?1440 05/24/21 ?0128  ?[id number] 165*  --   ?FOLATE 12.4  --   ?FERRITIN 4*  --   ?TIBC 491*  --   ?IRON 11*  --   ?RETICCTPCT  --  1.1  ? ? ?Sepsis Labs: ?Recent Labs  ?Lab 05/23/21 ?1440  ?PROCALCITON <0.10  ? ? ? ?Recent Results (from the past 240 hour(s))  ?MRSA  Next Gen by PCR, Nasal     Status: None  ? Collection Time: 05/24/21  2:05 AM  ? Specimen: Nasal Mucosa; Nasal Swab  ?Result Value Ref Range Status  ? MRSA by PCR Next Gen NOT DETECTED NOT DETECTED Final  ?  Comment: (NOTE) ?The GeneXpert MRSA Assay (FDA approved for NASAL specimens only), ?is one component of a comprehensive MRSA colonization surveillance ?program. It is not intended to diagnose MRSA infection nor to guide ?or monitor treatment for MRSA infections. ?Test performance is not FDA approved in patients less than 2 years ?old. ?Performed at [facility], [address] [place], [place] [address] ?  ? ? ?Radiology Studies: ?  ECHOCARDIOGRAM COMPLETE ? ?Result Date: 05/24/2021 ?   ECHOCARDIOGRAM REPORT   Patient Name:   Ricky [name] Date of Exam: 05/24/2021 Medical Rec #:  [id number]      Height:       74.0 in Accession #:    [id number]     Weight:       187.4 lb Date of Birth:  1935/03/09      BSA:          2.114 m? Patient Age:    20 years       BP:           122/65 mmHg Patient Gender: M              HR:           104 bpm. Exam Location:  [facility] Procedure: 2D Echo, Cardiac Doppler and Color Doppler Indications:    CHF  History:        Patient has no prior history of Echocardiogram examinations.                 CHF; Risk Factors:Hypertension.  Sonographer:    Wenda Low Referring Phys: [place]  1. Left ventricular ejection fraction, by estimation, is 50%. The left ventricle has low normal function. The left ventricle has no regional wall motion abnormalities. There is moderate left ventricular hypertrophy. Left ventricular diastolic parameters  are indeterminate.  2. IVC is poorly visualzied. PASP is at least 46 mmHg, at least moderate pulmonary HTN. . Right ventricular systolic function is mildly reduced. The right ventricular size is moderately enlarged.  3. Left atrial size was severely dilated.  4. Moderate pleural effusion in the left lateral region.  5. The  mitral valve is abnormal. Mild mitral valve regurgitation. Mild mitral stenosis.  6. The tricuspid valve is abnormal.  7. The aortic valve is tricuspid. There is moderate calcification of the aortic valv

## 2021-05-27 ENCOUNTER — Encounter (HOSPITAL_COMMUNITY)
Admission: EM | Disposition: A | Discharge status: Home Health | Payer: Self-pay | Source: Home / Self Care | Attending: Internal Medicine

## 2021-05-27 ENCOUNTER — Inpatient Hospital Stay (HOSPITAL_COMMUNITY): Payer: Medicare PPO | Admitting: Anesthesiology

## 2021-05-27 DIAGNOSIS — K635 Polyp of colon: Secondary | ICD-10-CM

## 2021-05-27 DIAGNOSIS — K624 Stenosis of anus and rectum: Secondary | ICD-10-CM

## 2021-05-27 DIAGNOSIS — K449 Diaphragmatic hernia without obstruction or gangrene: Secondary | ICD-10-CM

## 2021-05-27 DIAGNOSIS — K644 Residual hemorrhoidal skin tags: Secondary | ICD-10-CM

## 2021-05-27 DIAGNOSIS — K2289 Other specified disease of esophagus: Secondary | ICD-10-CM

## 2021-05-27 HISTORY — PX: POLYPECTOMY: SHX5525

## 2021-05-27 HISTORY — PX: SUBMUCOSAL LIFTING INJECTION: SHX6855

## 2021-05-27 HISTORY — PX: BIOPSY: SHX5522

## 2021-05-27 HISTORY — PX: ESOPHAGOGASTRODUODENOSCOPY (EGD) WITH PROPOFOL: SHX5813

## 2021-05-27 HISTORY — PX: COLONOSCOPY WITH PROPOFOL: SHX5780

## 2021-05-27 LAB — TYPE AND SCREEN
ABO/RH(D): A POS
Antibody Screen: NEGATIVE
Unit division: 0
Unit division: 0
Unit division: 0
Unit division: 0

## 2021-05-27 LAB — BASIC METABOLIC PANEL
Anion gap: 6 (ref 5–15)
BUN: 20 mg/dL (ref 8–23)
CO2: 29 mmol/L (ref 22–32)
Calcium: 8.2 mg/dL — ABNORMAL LOW (ref 8.9–10.3)
Chloride: 104 mmol/L (ref 98–111)
Creatinine, Ser: 1.46 mg/dL — ABNORMAL HIGH (ref 0.61–1.24)
GFR, Estimated: 47 mL/min — ABNORMAL LOW (ref >60)
Glucose, Bld: 96 mg/dL (ref 70–99)
Potassium: 3.4 mmol/L — ABNORMAL LOW (ref 3.5–5.1)
Sodium: 139 mmol/L (ref 135–145)

## 2021-05-27 LAB — BPAM RBC
Blood Product Expiration Date: 202305182359
Blood Product Expiration Date: 202305182359
Blood Product Expiration Date: 202305182359
Blood Product Expiration Date: 202305202359
ISSUE DATE / TIME: 202304191702
ISSUE DATE / TIME: 202304192137
ISSUE DATE / TIME: 202304211025
ISSUE DATE / TIME: 202304211359
Unit Type and Rh: 6200
Unit Type and Rh: 6200
Unit Type and Rh: 6200
Unit Type and Rh: 6200

## 2021-05-27 LAB — CBC
HCT: 30 % — ABNORMAL LOW (ref 39.0–52.0)
Hemoglobin: 8.6 g/dL — ABNORMAL LOW (ref 13.0–17.0)
MCH: 21 pg — ABNORMAL LOW (ref 26.0–34.0)
MCHC: 28.7 g/dL — ABNORMAL LOW (ref 30.0–36.0)
MCV: 73.3 fL — ABNORMAL LOW (ref 80.0–100.0)
Platelets: 346 10*3/uL (ref 150–400)
RBC: 4.09 MIL/uL — ABNORMAL LOW (ref 4.22–5.81)
RDW: 23.2 % — ABNORMAL HIGH (ref 11.5–15.5)
WBC: 6.3 10*3/uL (ref 4.0–10.5)
nRBC: 0 % (ref 0.0–0.2)

## 2021-05-27 SURGERY — ESOPHAGOGASTRODUODENOSCOPY (EGD) WITH PROPOFOL
Anesthesia: General

## 2021-05-27 MED ORDER — PROPOFOL 10 MG/ML IV BOLUS
INTRAVENOUS | Status: DC | PRN
Start: 2021-05-27 — End: 2021-05-27
  Administered 2021-05-27: 110 mg via INTRAVENOUS
  Administered 2021-05-27: 90 mg via INTRAVENOUS

## 2021-05-27 MED ORDER — FERROUS SULFATE 325 (65 FE) MG PO TBEC: 325.0000 mg | DELAYED_RELEASE_TABLET | Freq: Two times a day (BID) | ORAL | 3 refills | Status: AC

## 2021-05-27 MED ORDER — FUROSEMIDE 20 MG PO TABS
20.0000 mg | ORAL_TABLET | Freq: Every day | ORAL | Status: DC
  Administered 2021-05-27: 20 mg via ORAL
  Filled 2021-05-27: qty 1

## 2021-05-27 MED ORDER — METOPROLOL TARTRATE 5 MG/5ML IV SOLN
INTRAVENOUS | Status: AC
  Filled 2021-05-27: qty 5

## 2021-05-27 MED ORDER — STERILE WATER FOR IRRIGATION IR SOLN
Status: DC | PRN
  Administered 2021-05-27: 180 mL

## 2021-05-27 MED ORDER — FOLIC ACID 1 MG PO TABS: 1.0000 mg | ORAL_TABLET | Freq: Every day | ORAL | 0 refills | Status: AC

## 2021-05-27 MED ORDER — LACTATED RINGERS IV SOLN: INTRAVENOUS | Status: DC | PRN

## 2021-05-27 MED ORDER — CARVEDILOL 6.25 MG PO TABS: 6.2500 mg | ORAL_TABLET | Freq: Two times a day (BID) | ORAL | 0 refills | Status: AC

## 2021-05-27 MED ORDER — CYANOCOBALAMIN 250 MCG PO TABS: 250.0000 ug | ORAL_TABLET | Freq: Every day | ORAL | 0 refills | Status: AC

## 2021-05-27 MED ORDER — CARVEDILOL 3.125 MG PO TABS
6.2500 mg | ORAL_TABLET | Freq: Two times a day (BID) | ORAL | Status: DC
  Administered 2021-05-27 (×2): 6.25 mg via ORAL
  Filled 2021-05-27 (×2): qty 2

## 2021-05-27 MED ORDER — METOPROLOL TARTRATE 5 MG/5ML IV SOLN
INTRAVENOUS | Status: DC | PRN
  Administered 2021-05-27: 3 mg via INTRAVENOUS

## 2021-05-27 MED ORDER — BENAZEPRIL HCL 5 MG PO TABS: 5.0000 mg | ORAL_TABLET | Freq: Every day | ORAL | 0 refills | Status: AC

## 2021-05-27 NOTE — Discharge Summary (Signed)
Physician Discharge Summary  ?Ricky [name] [id number] DOB: [birth year] DOA: 05/23/2021 ? ?PCP: Neale Burly, MD ? ?Admit date: 05/23/2021 ?Discharge date: 05/27/2021 ? ?Admitted From: Home ?Disposition: Home ? ?Recommendations for Outpatient Follow-up:  ?Follow up with PCP in 1-2 weeks ?Please obtain BMP/CBC in one week ?Please follow up with cardiology as scheduled ? ?Home Health: None ?Equipment/Devices: None ? ?Discharge Condition: Stable ?CODE STATUS: Full ?Diet recommendation: Low-salt low-fat diet as tolerated ? ?Brief/Interim Summary: ?Ricky [name] is a 86 y.o. male with medical history significant for HTN, hard of hearing. Patient was brought to the ED with reports of 10 pound weight gain since January, leg swelling, difficulty breathing when walking. Patient was started on 20 mg Lasix in January, reports missing some doses.  Patient admitted for presumed heart failure exacerbation with symptomatic anemia as below. ? ?Discharge Diagnoses:  ?Principal Problem: ?  Acute congestive heart failure ([place]) ?Active Problems: ?  HTN (hypertension) ?  Acute anemia ? ?Acute symptomatic anemia, likely chronic iron deficiency anemia of chronic disease ?-FOBT negative, iron panel remarkable for low iron ?-Patient received 3.5 unit PRBC during hospitalization, hemoglobin uptrending on its own over the past 48 hours as appropriate ?-Status post IV iron B12 and folate, continue these at discharge ?-GI consulted, subsequent FOBT negative, unremarkable endoscopy in house ?  ?Acute hypoxic respiratory failure multifactorial, resolved ?Presumed diastolic congestive heart failure ([place]) ?Symptomatic anemia ?-Echo shows EF of 50% low normal function without wall abnormalities, indeterminate diastolic function ?-BNP elevated, hemoglobin 4.9 remains moderately hypoxic at rest -worse with exertion ?-Presumed cardiac dysfunction and symptoms related to profound anemia, resolved with transfusion and diuresis ?-Follow-up outpatient  with cardiology as discussed ?  ?SIRS, without sepsis ?-Reactive in the setting of profound anemia and volume overload, resolved ?   ?Tachydysrhythmia  ?-Questionably provoked A-fib versus sinus tach with PACs ?-Given patient's age, fall risk and further discussion we will continue patient at discharge on carvedilol only for rate control, and hold anticoagulation ?-If patient's heart rate and rhythm do not resolve in the next 1 to 2 weeks as he recovers from his profound anemia primary care versus cardiology could consider further work-up and evaluation for possible Holter monitor placement versus further discussion of anticoagulation if indicated. ? ?HTN (hypertension) ?Currently well controlled on benazepril ? ?Discharge Instructions ? ?Discharge Instructions   ? ? Discharge patient   Complete by: As directed ?  ? Discharge disposition: 01-Home or Self Care  ? Discharge patient date: 05/27/2021  ? ?  ? ?Allergies as of 05/27/2021   ? ?   Reactions  ? Penicillins   ? ?  ? ?  ?Medication List  ?  ? ?TAKE these medications   ? ?ALLERGY PO ?Take 10 mg by mouth daily. ?  ?aspirin EC 81 MG tablet ?Take 81 mg by mouth daily. Swallow whole. ?  ?benazepril 5 MG tablet ?Commonly known as: LOTENSIN ?Take 1 tablet (5 mg total) by mouth daily. ?What changed:  ?medication strength ?how much to take ?  ?carvedilol 6.25 MG tablet ?Commonly known as: COREG ?Take 1 tablet (6.25 mg total) by mouth 2 (two) times daily with a meal. ?  ?ferrous sulfate 325 (65 FE) MG EC tablet ?Take 1 tablet (325 mg total) by mouth 2 (two) times daily. ?  ?folic acid 1 MG tablet ?Commonly known as: FOLVITE ?Take 1 tablet (1 mg total) by mouth daily. ?  ?furosemide 20 MG tablet ?Commonly known as: LASIX ?Take 20 mg by mouth daily. ?  ?  potassium chloride 10 MEQ tablet ?Commonly known as: KLOR-CON ?Take 10 mEq by mouth daily. ?  ?vitamin B-12 250 MCG tablet ?Commonly known as: CYANOCOBALAMIN ?Take 1 tablet (250 mcg total) by mouth daily. ?  ? ?   ? ? ?Allergies  ?Allergen Reactions  ? Penicillins   ? ? ?Consultations: ?GI ? ?Procedures/Studies: ?DG Chest Port 1 View ? ?Result Date: 05/24/2021 ?CLINICAL DATA:  Acute congestive heart failure EXAM: PORTABLE CHEST 1 VIEW COMPARISON:  05/23/2021 FINDINGS: Cardiac size is mildly enlarged. Moderate perihilar interstitial pulmonary infiltrate has progressed and small bilateral pleural effusions have now developed in keeping with changes of progressive cardiogenic failure. No pneumothorax. No acute bone abnormality. IMPRESSION: Progressive, moderate cardiogenic failure with increasing perihilar interstitial pulmonary edema and development of small bilateral pleural effusions. Electronically Signed   By: Helyn Numbers M.D.   On: 05/24/2021 00:23  ? ?DG Chest Portable 1 View ? ?Result Date: 05/23/2021 ?CLINICAL DATA:  Short of breath, dyspnea on exertion EXAM: PORTABLE CHEST 1 VIEW COMPARISON:  None. FINDINGS: Single frontal view of the chest demonstrates mild enlargement the cardiac silhouette. Veiling opacity left lung base consistent with consolidation and/or small effusion. Central vascular congestion. No pneumothorax. IMPRESSION: 1. Left basilar consolidation and effusion, which could reflect pneumonia or atelectasis. 2. Central vascular congestion. 3. Borderline enlarged cardiac silhouette. Electronically Signed   By: Sharlet Salina M.D.   On: 05/23/2021 15:19  ? ?ECHOCARDIOGRAM COMPLETE ? ?Result Date: 05/24/2021 ?   ECHOCARDIOGRAM REPORT   Patient Name:   Ricky [name] Date of Exam: 05/24/2021 Medical Rec #:  [id number]      Height:       74.0 in Accession #:    [id number]     Weight:       187.4 lb Date of Birth:  Nov 09, 1935      BSA:          2.114 m? Patient Age:    85 years       BP:           122/65 mmHg Patient Gender: M              HR:           104 bpm. [facility]am Location:  [facility] Procedure: 2D Echo, Cardiac Doppler and Color Doppler Indications:    CHF  History:        Patient has no prior history of  Echocardiogram examinations.                 CHF; Risk Factors:Hypertension.  Sonographer:    Mikki Harbor Referring Phys: [phone] Heloise Beecham EMOKPAE IMPRESSIONS  1. Left ventricular ejection fraction, by estimation, is 50%. The left ventricle has low normal function. The left ventricle has no regional wall motion abnormalities. There is moderate left ventricular hypertrophy. Left ventricular diastolic parameters  are indeterminate.  2. IVC is poorly visualzied. PASP is at least 46 mmHg, at least moderate pulmonary HTN. . Right ventricular systolic function is mildly reduced. The right ventricular size is moderately enlarged.  3. Left atrial size was severely dilated.  4. Moderate pleural effusion in the left lateral region.  5. The mitral valve is abnormal. Mild mitral valve regurgitation. Mild mitral stenosis.  6. The tricuspid valve is abnormal.  7. The aortic valve is tricuspid. There is moderate calcification of the aortic valve. There is moderate thickening of the aortic valve. Aortic valve regurgitation is not visualized. No aortic stenosis is present.  8. Aortic dilatation  noted. There is mild dilatation of the ascending aorta, measuring 40 mm. FINDINGS  Left Ventricle: Left ventricular ejection fraction, by estimation, is 50%. The left ventricle has low normal function. The left ventricle has no regional wall motion abnormalities. The left ventricular internal cavity size was normal in size. There is moderate left ventricular hypertrophy. Left ventricular diastolic parameters are indeterminate. Right Ventricle: IVC is poorly visualzied. PASP is at least 46 mmHg, at least moderate pulmonary HTN. The right ventricular size is moderately enlarged. No increase in right ventricular wall thickness. Right ventricular systolic function is mildly reduced. Left Atrium: Left atrial size was severely dilated. Right Atrium: Right atrial size was normal in size. Pericardium: There is no evidence of pericardial  effusion. Mitral Valve: The mitral valve is abnormal. There is mild thickening of the mitral valve leaflet(s). There is mild calcification of the mitral valve leaflet(s). Mild mitral annular calcification. Mild mitral valve

## 2021-05-27 NOTE — Progress Notes (Signed)
Patient ambulated the hall x 2 and voided ~25 cc without issue. MD made aware. Discharge instructions reviewed with patient and family members at bedside. PIV discontinued. Patient wheeled off the unit accompanied by family and RN in stable condition. ?

## 2021-05-27 NOTE — Op Note (Signed)
[facility] ?Patient Name: Ricky [name] ?Procedure Date: 05/27/2021 9:45 AM ?MRN: [id number] ?Date of Birth: 17-Nov-1935 ?Attending MD: Hildred Laser , MD ?CSN: [id number] ?Age: 86 ?Admit Type: Outpatient ?Procedure:                Colonoscopy ?Indications:              Unexplained iron deficiency anemia ?Providers:                Hildred Laser, MD, Caprice Kluver, Aram Candela ?Referring MD:             Holli Humbles, MD ?Medicines:                Propofol per Anesthesia ?Complications:            No immediate complications. ?Estimated Blood Loss:     Estimated blood loss was minimal. ?Procedure:                Pre-Anesthesia Assessment: ?                          - Prior to the procedure, a History and Physical  ?                          was performed, and patient medications and  ?                          allergies were reviewed. The patient's tolerance of  ?                          previous anesthesia was also reviewed. The risks  ?                          and benefits of the procedure and the sedation  ?                          options and risks were discussed with the patient.  ?                          All questions were answered, and informed consent  ?                          was obtained. Prior Anticoagulants: The patient has  ?                          taken no previous anticoagulant or antiplatelet  ?                          agents except for aspirin. ASA Grade Assessment:  ?                          III - A patient with severe systemic disease. After  ?                          reviewing the risks and benefits, the patient was  ?  deemed in satisfactory condition to undergo the  ?                          procedure. ?                          After obtaining informed consent, the colonoscope  ?                          was passed under direct vision. Throughout the  ?                          procedure, the patient's blood pressure, pulse, and  ?                           oxygen saturations were monitored continuously. The  ?                          PCF-HQ190L ([id number] scope was introduced through  ?                          the anus and advanced to the the cecum, identified  ?                          by appendiceal orifice and ileocecal valve. The  ?                          colonoscopy was performed without difficulty. The  ?                          patient tolerated the procedure well. The quality  ?                          of the bowel preparation was adequate. The  ?                          ileocecal valve, appendiceal orifice, and rectum  ?                          were photographed. ?Scope In: 10:08:57 AM ?Scope Out: 10:30:16 AM ?Scope Withdrawal Time: 0 hours 16 minutes 11 seconds  ?Total Procedure Duration: 0 hours 21 minutes 19 seconds  ?Findings: ?     The digital rectal exam findings include anal stricture. ?     A small polyp was found in the proximal ascending colon. The polyp was  ?     sessile. Biopsies were taken with a cold forceps for histology. The  ?     pathology specimen was placed into Bottle Number 2. ?     A 4 to 10 mm polyp was found in the proximal descending colon. The polyp  ?     was multi-lobulated. Area was successfully injected with 9 mL Eleview  ?     for lesion assessment, and this injection appeared to lift the lesion  ?     adequately. The polyp was removed with a cold snare. Resection and  ?  retrieval were complete. Two hemostatic clips were successfully placed  ?     (MR conditional). ?     External hemorrhoids were found during retroflexion. The hemorrhoids  ?     were small. ?     A scar was found in the cecum. ?Impression:               - Anal stricture found on digital rectal exam. ?                          - Linear cecal scar either secondary to prior  ?                          polypectomy or healed ulcer. ?                          - One small polyp in the proximal ascending colon.  ?                          Biopsied. ?                           - One 4 to 10 mm polyp in the proximal descending  ?                          colon, removed with a cold snare. Resected and  ?                          retrieved. Injected. Clips (MR conditional) were  ?                          placed. ?                          - External hemorrhoids. ?Moderate Sedation: ?     Per Anesthesia Care ?Recommendation:           - Patient has a contact number available for  ?                          emergencies. The signs and symptoms of potential  ?                          delayed complications were discussed with the  ?                          patient. Return to normal activities tomorrow.  ?                          Written discharge instructions were provided to the  ?                          patient. ?                          - Cardiac diet today. ?                          -  Continue present medications. ?                          - No aspirin, ibuprofen, naproxen, or other  ?                          non-steroidal anti-inflammatory drugs for 1 day. ?                          - Begin p.o. iron. ?                          - Await pathology results. ?Procedure Code(s):        --- Professional --- ?                          [phone], Colonoscopy, flexible; with removal of  ?                          tumor(s), polyp(s), or other lesion(s) by snare  ?                          technique ?                          45380, 59, Colonoscopy, flexible; with biopsy,  ?                          single or multiple ?                          45381, Colonoscopy, flexible; with directed  ?                          submucosal injection(s), any substance ?Diagnosis Code(s):        --- Professional --- ?                          K62.4, Stenosis of anus and rectum ?                          K63.5, Polyp of colon ?                          K64.4, Residual hemorrhoidal skin tags ?                          D50.9, Iron deficiency anemia, unspecified ?CPT copyright 2019 American  Medical Association. All rights reserved. ?The codes documented in this report are preliminary and upon coder review may  ?be revised to meet current compliance requirements. ?Hildred Laser, MD ?Hildred Laser, MD ?05/27/2021 10:52:45 AM ?This report has been signed electronically. ?Number of Addenda: 0 ?

## 2021-05-27 NOTE — Transfer of Care (Signed)
Immediate Anesthesia Transfer of Care Note ? ?Patient: Ricky [name] ? ?Procedure(s) Performed: ESOPHAGOGASTRODUODENOSCOPY (EGD) WITH PROPOFOL ?BIOPSY ?POLYPECTOMY ?SUBMUCOSAL LIFTING INJECTION ?COLONOSCOPY WITH PROPOFOL ? ?Patient Location: PACU ? ?Anesthesia Type:General ? ?Level of Consciousness: drowsy ? ?Airway & Oxygen Therapy: Patient Spontanous Breathing and Patient connected to nasal cannula oxygen ? ?Post-op Assessment: Report given to RN and Post -op Vital signs reviewed and stable ? ?Post vital signs: Reviewed and stable ? ?Last Vitals:  ?Vitals Value Taken Time  ?BP    ?Temp    ?Pulse    ?Resp 18 05/27/21 1033  ?SpO2    ?Vitals shown include unvalidated device data. ? ?Last Pain:  ?Vitals:  ? 05/27/21 0952  ?TempSrc:   ?PainSc: 0-No pain  ?   ? ?  ? ?Complications: No notable events documented. ?

## 2021-05-27 NOTE — Anesthesia Postprocedure Evaluation (Signed)
Anesthesia Post Note ? ?Patient: Ricky [name] ? ?Procedure(s) Performed: ESOPHAGOGASTRODUODENOSCOPY (EGD) WITH PROPOFOL ?BIOPSY ?POLYPECTOMY ?SUBMUCOSAL LIFTING INJECTION ?COLONOSCOPY WITH PROPOFOL ? ?Patient location during evaluation: PACU ?Anesthesia Type: General ?Level of consciousness: awake and alert ?Pain management: pain level controlled ?Vital Signs Assessment: post-procedure vital signs reviewed and stable ?Respiratory status: spontaneous breathing, nonlabored ventilation, respiratory function stable and patient connected to nasal cannula oxygen ?Cardiovascular status: blood pressure returned to baseline and stable ?Postop Assessment: no apparent nausea or vomiting ?Anesthetic complications: no ? ? ?No notable events documented. ? ? ?Last Vitals:  ?Vitals:  ? 05/27/21 0800 05/27/21 0930  ?BP: (!) 126/50 117/70  ?Pulse: (!) 102   ?Resp: (!) 25   ?Temp:  36.6 ?C  ?SpO2: (!) 88% 100%  ?  ?Last Pain:  ?Vitals:  ? 05/27/21 0952  ?TempSrc:   ?PainSc: 0-No pain  ? ? ?  ?  ?  ?  ?  ?  ? ?Windell [place] ? ? ? ? ?

## 2021-05-27 NOTE — Progress Notes (Signed)
?Subjective: ? ?Patient has no complaints.  He denies chest pain shortness of breath or abdominal pain.  He says he had tick bite 6 days ago.  He wonders if he has had fever during this admission.  I told him he has been afebrile. ? ?Current Medications: ? ?Current Facility-Administered Medications:  ?  0.9 %  sodium chloride infusion (Manually program via Guardrails IV Fluids), , Intravenous, Once, Triplett, Tammy, PA-C ?  0.9 %  sodium chloride infusion (Manually program via Guardrails IV Fluids), , Intravenous, Once, Little Ishikawa, MD, Held at 05/25/21 1108 ?  acetaminophen (TYLENOL) tablet 650 mg, 650 mg, Oral, Q6H PRN **OR** acetaminophen (TYLENOL) suppository 650 mg, 650 mg, Rectal, Q6H PRN, Emokpae, Ejiroghene E, MD ?  benazepril (LOTENSIN) tablet 20 mg, 20 mg, Oral, Daily, Emokpae, Ejiroghene E, MD, 20 mg at 05/26/21 0945 ?  Chlorhexidine Gluconate Cloth 2 % PADS 6 each, 6 each, Topical, Daily, Adefeso, Oladapo, DO, 6 each at 05/26/21 1556 ?  folic acid (FOLVITE) tablet 1 mg, 1 mg, Oral, Daily, Little Ishikawa, MD, 1 mg at 05/26/21 0945 ?  furosemide (LASIX) tablet 20 mg, 20 mg, Oral, Daily, Little Ishikawa, MD ?  MEDLINE mouth rinse, 15 mL, Mouth Rinse, BID, Little Ishikawa, MD, 15 mL at 05/26/21 2145 ?  pantoprazole (PROTONIX) injection 40 mg, 40 mg, Intravenous, Q24H, Emokpae, Ejiroghene E, MD, 40 mg at 05/26/21 2142 ?  vitamin B-12 (CYANOCOBALAMIN) tablet 250 mcg, 250 mcg, Oral, Daily, Little Ishikawa, MD, 250 mcg at 05/26/21 0945 ? ? ? ?Objective: ?Blood pressure (!) 94/39, pulse (!) 110, temperature 98.3 ?F (36.8 ?C), temperature source Oral, resp. rate 19, height _0  (1.88 m), weight 77.8 kg, SpO2 94 %. ?Patient is alert and in no acute distress. ?He has severe hearing impairment. ?JVD does not appear to be elevated. ?Cardiac exam with irregular rhythm normal S1 and S2. No murmur or gallop noted. ?Auscultation lungs reveal few crackles at right base. ?Abdomen is flat.   There is circumferential erythema surrounding the site of tick bite in mid abdomen.  There is no induration.  Abdomen is soft.  No organomegaly or masses. ?LE edema primarily around ankles no more than 1+. ? ?Labs/studies Results: ? ? ? ?  Latest Ref Rng & Units 05/27/2021  ?  6:01 AM 05/26/2021  ?  4:34 AM 05/25/2021  ?  7:01 PM  ?CBC  ?WBC 4.0 - 10.5 K/uL 6.3   6.9   9.8    ?Hemoglobin 13.0 - 17.0 g/dL 8.6   8.1   8.7    ?Hematocrit 39.0 - 52.0 % 30.0   29.0   30.4    ?Platelets 150 - 400 K/uL 346   378   434    ?  ? ?  Latest Ref Rng & Units 05/27/2021  ?  6:01 AM 05/26/2021  ?  4:34 AM 05/25/2021  ?  4:07 AM  ?CMP  ?Glucose 70 - 99 mg/dL 96   95   98    ?BUN 8 - 23 mg/dL _1 ?Creatinine 0.61 - 1.24 mg/dL 1.46   1.69   1.62    ?Sodium 135 - 145 mmol/L 139   137   140    ?Potassium 3.5 - 5.1 mmol/L 3.4   3.7   4.1    ?Chloride 98 - 111 mmol/L 104   101   107    ?CO2 22 - 32 mmol/L  _0 ?Calcium 8.9 - 10.3 mg/dL 8.2   7.8   8.1    ?  ? ?  Latest Ref Rng & Units 05/23/2021  ?  2:40 PM  ?Hepatic Function  ?Total Protein 6.5 - 8.1 g/dL 6.8    ?Albumin 3.5 - 5.0 g/dL 3.5    ?AST 15 - 41 U/L 19    ?ALT 0 - 44 U/L 10    ?Alk Phosphatase 38 - 126 U/L 45    ?Total Bilirubin 0.3 - 1.2 mg/dL 0.6    ?Bilirubin, Direct 0.0 - 0.2 mg/dL 0.1    ?  ? ? ?Assessment: ? ?#1.  Iron deficiency anemia.  His stool was guaiac negative.  He was able to take prep yesterday.  He is passing clear stools.  Patient has received total of 4 units of PRBCs.  His hemoglobin is now 8.6. ? ?#2.  CHF.  He has been diuresing.  CHF appears to be multifactorial.  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  He appears to be stable for procedures to be performed. ? ?#3.  Low B12 level.  He was begun on oral vitamin B-12 by Dr. Avon Gully yesterday. ? ?#4.  Atrial fibrillation.  This appears to be new.  He was found to be in A-fib last night and responded to low-dose IV metoprolol.  His  ventricular rate is now between 90 and 100. ? ? ?Plan: ? ?Proceed with diagnostic esophagogastroduodenoscopy and colonoscopy under monitored anesthesia care today. ? ? ? ? ? ?

## 2021-05-27 NOTE — Progress Notes (Signed)
Brief procedure note ? ?EGD. ? ?Normal mucosa of the esophagus. ?Irregular GE junction and mucosa with texture change.  Biopsy taken. ?2 cm size sliding hiatal hernia. ?Normal exams of stomach first second and third part of duodenum. ? ? ?Colonoscopy. ? ?Cecal scar either secondary to prior polypectomy or healed ulcer. ?10 x 5 mm sessile polyp at ascending colon.  Polyp lifted with Eleview and removed by cold snare polypectomy. ?Into the small polyp at ascending colon later by cold biopsy. ?Both polyps submitted together. ?External hemorrhoids without stigmata of bleed. ?

## 2021-05-27 NOTE — Op Note (Signed)
[facility] ?Patient Name: Ricky [name] ?Procedure Date: 05/27/2021 9:45 AM ?MRN: [id number] ?Date of Birth: Oct 23, 1935 ?Attending MD: Hildred Laser , MD ?CSN: [id number] ?Age: 86 ?Admit Type: Outpatient ?Procedure:                Upper GI endoscopy ?Indications:              Unexplained iron deficiency anemia ?Providers:                Hildred Laser, MD, Caprice Kluver, Aram Candela ?Referring MD:             Holli Humbles, MD ?Medicines:                Propofol per Anesthesia ?Complications:            No immediate complications. ?Estimated Blood Loss:     Estimated blood loss was minimal. ?Procedure:                Pre-Anesthesia Assessment: ?                          - Prior to the procedure, a History and Physical  ?                          was performed, and patient medications and  ?                          allergies were reviewed. The patient's tolerance of  ?                          previous anesthesia was also reviewed. The risks  ?                          and benefits of the procedure and the sedation  ?                          options and risks were discussed with the patient.  ?                          All questions were answered, and informed consent  ?                          was obtained. Prior Anticoagulants: The patient has  ?                          taken no previous anticoagulant or antiplatelet  ?                          agents except for aspirin. ASA Grade Assessment:  ?                          III - A patient with severe systemic disease. After  ?                          reviewing the risks and benefits, the patient was  ?  deemed in satisfactory condition to undergo the  ?                          procedure. ?                          After obtaining informed consent, the endoscope was  ?                          passed under direct vision. Throughout the  ?                          procedure, the patient's blood pressure, pulse, and  ?                           oxygen saturations were monitored continuously. The  ?                          [id number] scope was introduced through the  ?                          mouth, and advanced to the third part of duodenum.  ?                          The upper GI endoscopy was accomplished without  ?                          difficulty. The patient tolerated the procedure  ?                          well. ?Scope In: 9:58:26 AM ?Scope Out: 10:06:05 AM ?Total Procedure Duration: 0 hours 7 minutes 39 seconds  ?Findings: ?     The hypopharynx was normal. ?     The examined esophagus was normal. ?     The Z-line was irregular and was found 39 cm from the incisors. Focal  ?     area with texture change. Biopsies were taken with a cold forceps for  ?     histology. The pathology specimen was placed into Bottle Number 1. ?     A 2 cm hiatal hernia was present. ?     The entire examined stomach was normal. ?     The duodenal bulb, second portion of the duodenum and third portion of  ?     the duodenum were normal. ?Impression:               - Normal hypopharynx. ?                          - Normal esophagus. ?                          - Z-line irregular, 39 cm from the incisors. Focal  ?                          area with texture change. Biopsied. ?                          -  2 cm hiatal hernia. ?                          - Normal stomach. ?                          - Normal duodenal bulb, second portion of the  ?                          duodenum and third portion of the duodenum. ?Moderate Sedation: ?     Per Anesthesia Care ?Recommendation:           - Return patient to ICU for ongoing care. ?                          - Cardiac diet today. ?                          - Continue present medications. ?                          - No aspirin, ibuprofen, naproxen, or other  ?                          non-steroidal anti-inflammatory drugs for 1 day. ?                          - Await pathology results. ?Procedure Code(s):        ---  Professional --- ?                          [phone], Esophagogastroduodenoscopy, flexible,  ?                          transoral; with biopsy, single or multiple ?Diagnosis Code(s):        --- Professional --- ?                          K22.8, Other specified diseases of esophagus ?                          K44.9, Diaphragmatic hernia without obstruction or  ?                          gangrene ?                          D50.9, Iron deficiency anemia, unspecified ?CPT copyright 2019 American Medical Association. All rights reserved. ?The codes documented in this report are preliminary and upon coder review may  ?be revised to meet current compliance requirements. ?Hildred Laser, MD ?Hildred Laser, MD ?05/27/2021 10:42:40 AM ?This report has been signed electronically. ?Number of Addenda: 0 ?

## 2021-05-27 NOTE — Anesthesia Preprocedure Evaluation (Signed)
Anesthesia Evaluation  Patient identified by MRN, date of birth, ID band Patient awake    Reviewed: Allergy & Precautions, H&P , NPO status , Patient's Chart, lab work & pertinent test results, reviewed documented beta blocker date and time   Airway Mallampati: II  TM Distance: >3 FB Neck ROM: full    Dental no notable dental hx.    Pulmonary neg pulmonary ROS, former smoker,    Pulmonary exam normal breath sounds clear to auscultation       Cardiovascular Exercise Tolerance: Good hypertension, +CHF  + dysrhythmias Atrial Fibrillation  Rhythm:regular Rate:Normal     Neuro/Psych negative neurological ROS  negative psych ROS   GI/Hepatic negative GI ROS, Neg liver ROS,   Endo/Other  negative endocrine ROS  Renal/GU negative Renal ROS  negative genitourinary   Musculoskeletal   Abdominal   Peds  Hematology  (+) Blood dyscrasia, anemia ,   Anesthesia Other Findings   Reproductive/Obstetrics negative OB ROS                             Anesthesia Physical  Anesthesia Plan  ASA: 4 and emergent  Anesthesia Plan: General   Post-op Pain Management:    Induction:   PONV Risk Score and Plan: Propofol infusion  Airway Management Planned:   Additional Equipment:   Intra-op Plan:   Post-operative Plan:   Informed Consent: I have reviewed the patients History and Physical, chart, labs and discussed the procedure including the risks, benefits and alternatives for the proposed anesthesia with the patient or authorized representative who has indicated his/her understanding and acceptance.     Dental Advisory Given  Plan Discussed with: CRNA  Anesthesia Plan Comments:         Anesthesia Quick Evaluation  

## 2021-05-28 LAB — LYME DISEASE SEROLOGY W/REFLEX: Lyme Total Antibody EIA: NEGATIVE

## 2021-05-29 LAB — SURGICAL PATHOLOGY

## 2021-05-30 ENCOUNTER — Encounter (HOSPITAL_COMMUNITY): Payer: Self-pay | Admitting: Internal Medicine

## 2021-05-30 DIAGNOSIS — R339 Retention of urine, unspecified: Secondary | ICD-10-CM | POA: Diagnosis not present

## 2021-05-30 DIAGNOSIS — Z6824 Body mass index (BMI) 24.0-24.9, adult: Secondary | ICD-10-CM | POA: Diagnosis not present

## 2021-05-30 DIAGNOSIS — D508 Other iron deficiency anemias: Secondary | ICD-10-CM | POA: Diagnosis not present

## 2021-05-30 DIAGNOSIS — I5032 Chronic diastolic (congestive) heart failure: Secondary | ICD-10-CM | POA: Diagnosis not present

## 2021-06-13 DIAGNOSIS — Z6824 Body mass index (BMI) 24.0-24.9, adult: Secondary | ICD-10-CM | POA: Diagnosis not present

## 2021-06-13 DIAGNOSIS — R339 Retention of urine, unspecified: Secondary | ICD-10-CM | POA: Diagnosis not present

## 2021-06-14 DIAGNOSIS — Z20822 Contact with and (suspected) exposure to covid-19: Secondary | ICD-10-CM | POA: Diagnosis not present

## 2021-06-14 DIAGNOSIS — N3289 Other specified disorders of bladder: Secondary | ICD-10-CM | POA: Diagnosis not present

## 2021-06-14 DIAGNOSIS — I5023 Acute on chronic systolic (congestive) heart failure: Secondary | ICD-10-CM | POA: Diagnosis not present

## 2021-06-14 DIAGNOSIS — I509 Heart failure, unspecified: Secondary | ICD-10-CM | POA: Diagnosis not present

## 2021-06-14 DIAGNOSIS — R7989 Other specified abnormal findings of blood chemistry: Secondary | ICD-10-CM | POA: Diagnosis not present

## 2021-06-14 DIAGNOSIS — I110 Hypertensive heart disease with heart failure: Secondary | ICD-10-CM | POA: Diagnosis not present

## 2021-06-14 DIAGNOSIS — N3001 Acute cystitis with hematuria: Secondary | ICD-10-CM | POA: Diagnosis not present

## 2021-06-14 DIAGNOSIS — N401 Enlarged prostate with lower urinary tract symptoms: Secondary | ICD-10-CM | POA: Diagnosis not present

## 2021-06-14 DIAGNOSIS — R339 Retention of urine, unspecified: Secondary | ICD-10-CM | POA: Diagnosis not present

## 2021-06-14 DIAGNOSIS — I4510 Unspecified right bundle-branch block: Secondary | ICD-10-CM | POA: Diagnosis not present

## 2021-06-14 DIAGNOSIS — I517 Cardiomegaly: Secondary | ICD-10-CM | POA: Diagnosis not present

## 2021-06-15 DIAGNOSIS — R9389 Abnormal findings on diagnostic imaging of other specified body structures: Secondary | ICD-10-CM | POA: Diagnosis not present

## 2021-06-15 DIAGNOSIS — M7989 Other specified soft tissue disorders: Secondary | ICD-10-CM | POA: Diagnosis not present

## 2021-06-15 DIAGNOSIS — N3000 Acute cystitis without hematuria: Secondary | ICD-10-CM | POA: Diagnosis not present

## 2021-06-15 DIAGNOSIS — T83511A Infection and inflammatory reaction due to indwelling urethral catheter, initial encounter: Secondary | ICD-10-CM | POA: Diagnosis not present

## 2021-06-15 DIAGNOSIS — N3289 Other specified disorders of bladder: Secondary | ICD-10-CM | POA: Diagnosis not present

## 2021-06-15 DIAGNOSIS — I272 Pulmonary hypertension, unspecified: Secondary | ICD-10-CM | POA: Diagnosis not present

## 2021-06-15 DIAGNOSIS — I5023 Acute on chronic systolic (congestive) heart failure: Secondary | ICD-10-CM | POA: Diagnosis not present

## 2021-06-15 DIAGNOSIS — Z880 Allergy status to penicillin: Secondary | ICD-10-CM | POA: Diagnosis not present

## 2021-06-15 DIAGNOSIS — Z79899 Other long term (current) drug therapy: Secondary | ICD-10-CM | POA: Diagnosis not present

## 2021-06-15 DIAGNOSIS — I5022 Chronic systolic (congestive) heart failure: Secondary | ICD-10-CM | POA: Diagnosis not present

## 2021-06-15 DIAGNOSIS — I110 Hypertensive heart disease with heart failure: Secondary | ICD-10-CM | POA: Diagnosis not present

## 2021-06-15 DIAGNOSIS — N400 Enlarged prostate without lower urinary tract symptoms: Secondary | ICD-10-CM | POA: Diagnosis not present

## 2021-06-15 DIAGNOSIS — Z7982 Long term (current) use of aspirin: Secondary | ICD-10-CM | POA: Diagnosis not present

## 2021-06-15 DIAGNOSIS — D509 Iron deficiency anemia, unspecified: Secondary | ICD-10-CM | POA: Diagnosis not present

## 2021-06-15 DIAGNOSIS — R339 Retention of urine, unspecified: Secondary | ICD-10-CM | POA: Diagnosis not present

## 2021-06-15 DIAGNOSIS — R161 Splenomegaly, not elsewhere classified: Secondary | ICD-10-CM | POA: Diagnosis not present

## 2021-06-15 DIAGNOSIS — D72829 Elevated white blood cell count, unspecified: Secondary | ICD-10-CM | POA: Diagnosis not present

## 2021-06-15 DIAGNOSIS — Z792 Long term (current) use of antibiotics: Secondary | ICD-10-CM | POA: Diagnosis not present

## 2021-06-15 DIAGNOSIS — Z20822 Contact with and (suspected) exposure to covid-19: Secondary | ICD-10-CM | POA: Diagnosis not present

## 2021-06-16 DIAGNOSIS — I5023 Acute on chronic systolic (congestive) heart failure: Secondary | ICD-10-CM | POA: Diagnosis not present

## 2021-06-16 DIAGNOSIS — R339 Retention of urine, unspecified: Secondary | ICD-10-CM | POA: Diagnosis not present

## 2021-06-16 DIAGNOSIS — R9389 Abnormal findings on diagnostic imaging of other specified body structures: Secondary | ICD-10-CM | POA: Diagnosis not present

## 2021-06-16 DIAGNOSIS — D649 Anemia, unspecified: Secondary | ICD-10-CM | POA: Diagnosis not present

## 2021-06-16 DIAGNOSIS — N400 Enlarged prostate without lower urinary tract symptoms: Secondary | ICD-10-CM | POA: Diagnosis not present

## 2021-06-16 DIAGNOSIS — Z7982 Long term (current) use of aspirin: Secondary | ICD-10-CM | POA: Diagnosis not present

## 2021-06-16 DIAGNOSIS — Z880 Allergy status to penicillin: Secondary | ICD-10-CM | POA: Diagnosis not present

## 2021-06-16 DIAGNOSIS — Z20822 Contact with and (suspected) exposure to covid-19: Secondary | ICD-10-CM | POA: Diagnosis not present

## 2021-06-16 DIAGNOSIS — Z79899 Other long term (current) drug therapy: Secondary | ICD-10-CM | POA: Diagnosis not present

## 2021-06-16 DIAGNOSIS — I272 Pulmonary hypertension, unspecified: Secondary | ICD-10-CM | POA: Diagnosis not present

## 2021-06-16 DIAGNOSIS — D509 Iron deficiency anemia, unspecified: Secondary | ICD-10-CM | POA: Diagnosis not present

## 2021-06-16 DIAGNOSIS — I110 Hypertensive heart disease with heart failure: Secondary | ICD-10-CM | POA: Diagnosis not present

## 2021-06-16 DIAGNOSIS — N3000 Acute cystitis without hematuria: Secondary | ICD-10-CM | POA: Diagnosis not present

## 2021-06-16 DIAGNOSIS — T83511A Infection and inflammatory reaction due to indwelling urethral catheter, initial encounter: Secondary | ICD-10-CM | POA: Diagnosis not present

## 2021-06-16 DIAGNOSIS — I5022 Chronic systolic (congestive) heart failure: Secondary | ICD-10-CM | POA: Diagnosis not present

## 2021-06-17 DIAGNOSIS — Z792 Long term (current) use of antibiotics: Secondary | ICD-10-CM | POA: Diagnosis not present

## 2021-06-17 DIAGNOSIS — I110 Hypertensive heart disease with heart failure: Secondary | ICD-10-CM | POA: Diagnosis not present

## 2021-06-17 DIAGNOSIS — T83511A Infection and inflammatory reaction due to indwelling urethral catheter, initial encounter: Secondary | ICD-10-CM | POA: Diagnosis not present

## 2021-06-17 DIAGNOSIS — Z880 Allergy status to penicillin: Secondary | ICD-10-CM | POA: Diagnosis not present

## 2021-06-17 DIAGNOSIS — B964 Proteus (mirabilis) (morganii) as the cause of diseases classified elsewhere: Secondary | ICD-10-CM | POA: Diagnosis not present

## 2021-06-17 DIAGNOSIS — I5023 Acute on chronic systolic (congestive) heart failure: Secondary | ICD-10-CM | POA: Diagnosis not present

## 2021-06-17 DIAGNOSIS — Z20822 Contact with and (suspected) exposure to covid-19: Secondary | ICD-10-CM | POA: Diagnosis not present

## 2021-06-17 DIAGNOSIS — D649 Anemia, unspecified: Secondary | ICD-10-CM | POA: Diagnosis not present

## 2021-06-17 DIAGNOSIS — I272 Pulmonary hypertension, unspecified: Secondary | ICD-10-CM | POA: Diagnosis not present

## 2021-06-17 DIAGNOSIS — R339 Retention of urine, unspecified: Secondary | ICD-10-CM | POA: Diagnosis not present

## 2021-06-17 DIAGNOSIS — Z79899 Other long term (current) drug therapy: Secondary | ICD-10-CM | POA: Diagnosis not present

## 2021-06-17 DIAGNOSIS — Z7982 Long term (current) use of aspirin: Secondary | ICD-10-CM | POA: Diagnosis not present

## 2021-06-17 DIAGNOSIS — R9389 Abnormal findings on diagnostic imaging of other specified body structures: Secondary | ICD-10-CM | POA: Diagnosis not present

## 2021-06-17 DIAGNOSIS — D509 Iron deficiency anemia, unspecified: Secondary | ICD-10-CM | POA: Diagnosis not present

## 2021-06-17 DIAGNOSIS — N3000 Acute cystitis without hematuria: Secondary | ICD-10-CM | POA: Diagnosis not present

## 2021-06-18 DIAGNOSIS — I5022 Chronic systolic (congestive) heart failure: Secondary | ICD-10-CM | POA: Diagnosis not present

## 2021-06-18 DIAGNOSIS — B964 Proteus (mirabilis) (morganii) as the cause of diseases classified elsewhere: Secondary | ICD-10-CM | POA: Diagnosis not present

## 2021-06-18 DIAGNOSIS — D509 Iron deficiency anemia, unspecified: Secondary | ICD-10-CM | POA: Diagnosis not present

## 2021-06-18 DIAGNOSIS — N3000 Acute cystitis without hematuria: Secondary | ICD-10-CM | POA: Diagnosis not present

## 2021-06-18 DIAGNOSIS — D649 Anemia, unspecified: Secondary | ICD-10-CM | POA: Diagnosis not present

## 2021-06-18 DIAGNOSIS — T83511A Infection and inflammatory reaction due to indwelling urethral catheter, initial encounter: Secondary | ICD-10-CM | POA: Diagnosis not present

## 2021-06-18 DIAGNOSIS — Z20822 Contact with and (suspected) exposure to covid-19: Secondary | ICD-10-CM | POA: Diagnosis not present

## 2021-06-18 DIAGNOSIS — Z7982 Long term (current) use of aspirin: Secondary | ICD-10-CM | POA: Diagnosis not present

## 2021-06-18 DIAGNOSIS — R161 Splenomegaly, not elsewhere classified: Secondary | ICD-10-CM | POA: Diagnosis not present

## 2021-06-18 DIAGNOSIS — N400 Enlarged prostate without lower urinary tract symptoms: Secondary | ICD-10-CM | POA: Diagnosis not present

## 2021-06-18 DIAGNOSIS — I272 Pulmonary hypertension, unspecified: Secondary | ICD-10-CM | POA: Diagnosis not present

## 2021-06-18 DIAGNOSIS — R339 Retention of urine, unspecified: Secondary | ICD-10-CM | POA: Diagnosis not present

## 2021-06-18 DIAGNOSIS — Z880 Allergy status to penicillin: Secondary | ICD-10-CM | POA: Diagnosis not present

## 2021-06-18 DIAGNOSIS — I110 Hypertensive heart disease with heart failure: Secondary | ICD-10-CM | POA: Diagnosis not present

## 2021-06-18 DIAGNOSIS — D1803 Hemangioma of intra-abdominal structures: Secondary | ICD-10-CM | POA: Diagnosis not present

## 2021-06-18 DIAGNOSIS — I5023 Acute on chronic systolic (congestive) heart failure: Secondary | ICD-10-CM | POA: Diagnosis not present

## 2021-06-19 ENCOUNTER — Encounter (INDEPENDENT_AMBULATORY_CARE_PROVIDER_SITE_OTHER): Payer: Self-pay

## 2021-06-19 DIAGNOSIS — R410 Disorientation, unspecified: Secondary | ICD-10-CM | POA: Diagnosis not present

## 2021-06-19 DIAGNOSIS — Z880 Allergy status to penicillin: Secondary | ICD-10-CM | POA: Diagnosis not present

## 2021-06-19 DIAGNOSIS — D509 Iron deficiency anemia, unspecified: Secondary | ICD-10-CM | POA: Diagnosis not present

## 2021-06-19 DIAGNOSIS — R339 Retention of urine, unspecified: Secondary | ICD-10-CM | POA: Diagnosis not present

## 2021-06-19 DIAGNOSIS — I110 Hypertensive heart disease with heart failure: Secondary | ICD-10-CM | POA: Diagnosis not present

## 2021-06-19 DIAGNOSIS — D649 Anemia, unspecified: Secondary | ICD-10-CM | POA: Diagnosis not present

## 2021-06-19 DIAGNOSIS — R6889 Other general symptoms and signs: Secondary | ICD-10-CM | POA: Diagnosis not present

## 2021-06-19 DIAGNOSIS — I272 Pulmonary hypertension, unspecified: Secondary | ICD-10-CM | POA: Diagnosis not present

## 2021-06-19 DIAGNOSIS — Z7982 Long term (current) use of aspirin: Secondary | ICD-10-CM | POA: Diagnosis not present

## 2021-06-19 DIAGNOSIS — I5023 Acute on chronic systolic (congestive) heart failure: Secondary | ICD-10-CM | POA: Diagnosis not present

## 2021-06-19 DIAGNOSIS — Z20822 Contact with and (suspected) exposure to covid-19: Secondary | ICD-10-CM | POA: Diagnosis not present

## 2021-06-19 DIAGNOSIS — D72829 Elevated white blood cell count, unspecified: Secondary | ICD-10-CM | POA: Diagnosis not present

## 2021-06-19 DIAGNOSIS — N3000 Acute cystitis without hematuria: Secondary | ICD-10-CM | POA: Diagnosis not present

## 2021-06-19 DIAGNOSIS — T83511A Infection and inflammatory reaction due to indwelling urethral catheter, initial encounter: Secondary | ICD-10-CM | POA: Diagnosis not present

## 2021-06-22 ENCOUNTER — Encounter: Payer: Self-pay | Admitting: Urology

## 2021-06-22 ENCOUNTER — Ambulatory Visit: Payer: Medicare PPO | Admitting: Urology

## 2021-06-22 VITALS — BP 142/57 | HR 77

## 2021-06-22 DIAGNOSIS — N138 Other obstructive and reflux uropathy: Secondary | ICD-10-CM | POA: Diagnosis not present

## 2021-06-22 DIAGNOSIS — R339 Retention of urine, unspecified: Secondary | ICD-10-CM

## 2021-06-22 DIAGNOSIS — N401 Enlarged prostate with lower urinary tract symptoms: Secondary | ICD-10-CM | POA: Diagnosis not present

## 2021-06-22 NOTE — Progress Notes (Signed)
PC   Assessment: 1. BPH with obstruction/lower urinary tract symptoms   2. Urinary retention     Plan: I reviewed the records from the patient's recent hospitalizations as well as from his PCP. I also reviewed the CT study from 06/15/2021 with results as noted below. I discussed the diagnosis and management of urinary retention and BPH with obstruction. Recommend continuing Foley catheter at this time. Continue alfuzosin 10 mg daily.  Unfortunately, his insurance will not cover silodosin. Complete antibiotics. Return to office in approximately 1 week for cystoscopy with voiding trial. I encouraged the patient to proceed with his GI and cardiac evaluation as these will be necessary should he require surgical management for his BPH and urinary retention.  Chief Complaint:  Chief Complaint  Patient presents with   Urinary Retention    History of Present Illness:  Ricky [name] is a 86 y.o. year old male who is seen in consultation from Toma Deiters, MD for evaluation of urinary retention.  He was admitted to the hospital in April 2023 with congestive heart failure.  A Foley catheter was placed at that time and was removed prior to discharge.  He developed urinary retention and had a catheter placed shortly after.  The catheter was left in place for approximately 2 weeks and removed by his PCP on 06/13/2021.  He was recently admitted to [facility] on 06/14/2021 for congestive heart failure and urinary retention.  A Foley catheter was placed.  He was also found to have a UTI.  Urine culture grew Proteus sensitive to Cipro.  He continues on the Cipro.  He has been on alfuzosin since 05/30/2021. He reports a history of frequency and nocturia 2-3 times for approximately 5 years.  No prior episodes of urinary retention.  He was not on any medical therapy prior to April 2023.  CT imaging from 06/15/2021 showed a decompressed bladder with a Foley catheter in place, circumferential bladder wall  thickening with some herniation of the bladder dome into the right inguinal ring, circumferential thickening and enhancement of bilateral distal ureters, no evidence of obstruction, and an enlarged prostate.  He has a prior history of nephrolithiasis undergoing open surgery for stone removal 40 years ago. No prior prostate or bladder surgery.  Past Medical History:  Past Medical History:  Diagnosis Date   Hypertension     Past Surgical History:  Past Surgical History:  Procedure Laterality Date   BIOPSY  05/27/2021   Procedure: BIOPSY;  Surgeon: Malissa Hippo, MD;  [facility];  Service: Endoscopy;;   COLONOSCOPY WITH PROPOFOL N/A 05/27/2021   Procedure: COLONOSCOPY WITH PROPOFOL;  Surgeon: Malissa Hippo, MD;  [facility];  Service: Endoscopy;  Laterality: N/A;   ESOPHAGOGASTRODUODENOSCOPY (EGD) WITH PROPOFOL N/A 05/27/2021   Procedure: ESOPHAGOGASTRODUODENOSCOPY (EGD) WITH PROPOFOL;  Surgeon: Malissa Hippo, MD;  [facility];  Service: Endoscopy;  Laterality: N/A;   KIDNEY STONE SURGERY     NASAL SEPTUM SURGERY     POLYPECTOMY  05/27/2021   Procedure: POLYPECTOMY;  Surgeon: Malissa Hippo, MD;  [facility];  Service: Endoscopy;;   SUBMUCOSAL LIFTING INJECTION  05/27/2021   Procedure: SUBMUCOSAL LIFTING INJECTION;  Surgeon: Malissa Hippo, MD;  [facility];  Service: Endoscopy;;   TONSILLECTOMY Bilateral     Allergies:  Allergies  Allergen Reactions   Penicillins     Family History:  Family History  Problem Relation Age of Onset  Colon cancer Neg Hx     Social History:  Social History   Tobacco Use   Smoking status: Former    Types: Cigarettes   Smokeless tobacco: Never  Substance Use Topics   Alcohol use: Never   Drug use: Never    Review of symptoms:  Constitutional:  Negative for unexplained weight loss, night sweats, fever, chills ENT:  Negative for nose bleeds, sinus pain, painful  swallowing CV:  Negative for chest pain, shortness of breath, exercise intolerance, palpitations, loss of consciousness Resp:  Negative for cough, wheezing, shortness of breath GI:  Negative for nausea, vomiting, diarrhea, bloody stools GU:  Positives noted in HPI; otherwise negative for gross hematuria, dysuria, urinary incontinence Neuro:  Negative for seizures, poor balance, limb weakness, slurred speech Psych:  Negative for lack of energy, depression, anxiety Endocrine:  Negative for polydipsia, polyuria, symptoms of hypoglycemia (dizziness, hunger, sweating) Hematologic:  Negative for anemia, purpura, petechia, prolonged or excessive bleeding, use of anticoagulants  Allergic:  Negative for difficulty breathing or choking as a result of exposure to anything; no shellfish allergy; no allergic response (rash/itch) to materials, foods  Physical exam: BP (!) 142/57   Pulse 77  GENERAL APPEARANCE:  Well appearing, well developed, well nourished, NAD HEENT: Atraumatic, Normocephalic, oropharynx clear. NECK: Supple without lymphadenopathy or thyromegaly. LUNGS: Clear to auscultation bilaterally. HEART: Regular Rate and Rhythm without murmurs, gallops, or rubs. ABDOMEN: Soft, non-tender, No Masses. EXTREMITIES: Moves all extremities well.  Without clubbing, cyanosis, or edema. NEUROLOGIC:  Alert and oriented x 3, normal gait, CN II-XII grossly intact.  MENTAL STATUS:  Appropriate. BACK:  Non-tender to palpation.  No CVAT SKIN:  Warm, dry and intact.   GU: Penis:  circumcised Meatus: foley in place draining clear urine Scrotum: normal, no masses Testis: normal without masses bilateral Prostate: 50+ g, NT Rectum: Normal tone,  no masses or tenderness   Results: None

## 2021-06-28 ENCOUNTER — Encounter: Payer: Self-pay | Admitting: Urology

## 2021-06-28 ENCOUNTER — Ambulatory Visit: Payer: Medicare PPO | Admitting: Urology

## 2021-06-28 VITALS — BP 132/57 | HR 87 | Ht 74.0 in | Wt 171.5 lb

## 2021-06-28 DIAGNOSIS — R339 Retention of urine, unspecified: Secondary | ICD-10-CM | POA: Diagnosis not present

## 2021-06-28 DIAGNOSIS — N401 Enlarged prostate with lower urinary tract symptoms: Secondary | ICD-10-CM | POA: Diagnosis not present

## 2021-06-28 DIAGNOSIS — N138 Other obstructive and reflux uropathy: Secondary | ICD-10-CM | POA: Diagnosis not present

## 2021-06-28 LAB — URINALYSIS, ROUTINE W REFLEX MICROSCOPIC
Bilirubin, UA: NEGATIVE
Glucose, UA: NEGATIVE
Ketones, UA: NEGATIVE
Nitrite, UA: NEGATIVE
Protein,UA: NEGATIVE
Specific Gravity, UA: 1.015 (ref 1.005–1.030)
Urobilinogen, Ur: 0.2 mg/dL (ref 0.2–1.0)
pH, UA: 5.5 (ref 5.0–7.5)

## 2021-06-28 LAB — MICROSCOPIC EXAMINATION
RBC, Urine: >30 /hpf — AB (ref 0–2)
Renal Epithel, UA: NONE SEEN /hpf

## 2021-06-28 LAB — BLADDER SCAN AMB NON-IMAGING: Scan Result: 75

## 2021-06-28 MED ORDER — CIPROFLOXACIN HCL 500 MG PO TABS
500.0000 mg | ORAL_TABLET | Freq: Once | ORAL | Status: AC
  Administered 2021-06-28: 500 mg via ORAL

## 2021-06-28 NOTE — Progress Notes (Signed)
Assessment: 1. BPH with obstruction/lower urinary tract symptoms   2. Urinary retention     Plan: Foley catheter removed after successful voiding trial. Continue alfuzosin 10 mg daily.   Cipro x1 following cystoscopy and Foley catheter removal. Return to office later today if he is unable to void. Patient was given a Foley catheter and bag to use if he is unable to void.  His granddaughter is a Charity fundraiser and is able to place a catheter if necessary. I encouraged the patient to proceed with his GI and cardiac evaluation as these will be necessary should he require surgical management for his BPH and urinary retention. Return to office in 7-10 days with bladder scan  Chief Complaint:  Chief Complaint  Patient presents with   Urinary Retention    History of Present Illness:  Ricky [name] is a 86 y.o. year old male who is seen for further evaluation of urinary retention.  He was admitted to the hospital in April 2023 with congestive heart failure.  A Foley catheter was placed at that time and was removed prior to discharge.  He developed urinary retention and had a catheter placed shortly after.  The catheter was left in place for approximately 2 weeks and removed by his PCP on 06/13/2021.  He was recently admitted to [facility] on 06/14/2021 for congestive heart failure and urinary retention.  A Foley catheter was again placed.  He was also found to have a UTI.  Urine culture grew Proteus sensitive to Cipro.  He continued on the Cipro.  He has been on alfuzosin since 05/30/2021. He reports a history of frequency and nocturia 2-3 times for approximately 5 years.  No prior episodes of urinary retention.  He was not on any medical therapy prior to April 2023.  CT imaging from 06/15/2021 showed a decompressed bladder with a Foley catheter in place, circumferential bladder wall thickening with some herniation of the bladder dome into the right inguinal ring, circumferential thickening and  enhancement of bilateral distal ureters, no evidence of obstruction, and an enlarged prostate.  He has a prior history of nephrolithiasis undergoing open surgery for stone removal 40 years ago. No prior prostate or bladder surgery.  He presents today for cystoscopy and voiding trial.  His foley has been draining well.  Portions of the above documentation were copied from a prior visit for review purposes only.   Past Medical History:  Past Medical History:  Diagnosis Date   Hypertension     Past Surgical History:  Past Surgical History:  Procedure Laterality Date   BIOPSY  05/27/2021   Procedure: BIOPSY;  Surgeon: Malissa Hippo, MD;  [facility];  Service: Endoscopy;;   COLONOSCOPY WITH PROPOFOL N/A 05/27/2021   Procedure: COLONOSCOPY WITH PROPOFOL;  Surgeon: Malissa Hippo, MD;  [facility];  Service: Endoscopy;  Laterality: N/A;   ESOPHAGOGASTRODUODENOSCOPY (EGD) WITH PROPOFOL N/A 05/27/2021   Procedure: ESOPHAGOGASTRODUODENOSCOPY (EGD) WITH PROPOFOL;  Surgeon: Malissa Hippo, MD;  [facility];  Service: Endoscopy;  Laterality: N/A;   KIDNEY STONE SURGERY     NASAL SEPTUM SURGERY     POLYPECTOMY  05/27/2021   Procedure: POLYPECTOMY;  Surgeon: Malissa Hippo, MD;  [facility];  Service: Endoscopy;;   SUBMUCOSAL LIFTING INJECTION  05/27/2021   Procedure: SUBMUCOSAL LIFTING INJECTION;  Surgeon: Malissa Hippo, MD;  [facility];  Service: Endoscopy;;   TONSILLECTOMY Bilateral     Allergies:  Allergies  Allergen Reactions   Penicillins     Family History:  Family History  Problem Relation Age of Onset   Colon cancer Neg Hx     Social History:  Social History   Tobacco Use   Smoking status: Former    Types: Cigarettes   Smokeless tobacco: Never  Substance Use Topics   Alcohol use: Never   Drug use: Never    ROS: Constitutional:  Negative for fever, chills, weight loss CV: Negative for chest pain,  previous MI, hypertension Respiratory:  Negative for shortness of breath, wheezing, sleep apnea, frequent cough GI:  Negative for nausea, vomiting, bloody stool, GERD  Physical exam: BP (!) 132/57   Pulse 87   Ht 6\' 2"  (1.88 m)   Wt 171 lb 8.3 oz (77.8 kg)   BMI 22.02 kg/m  GENERAL APPEARANCE:  Well appearing, well developed, well nourished, NAD HEENT:  Atraumatic, normocephalic, oropharynx clear NECK:  Supple without lymphadenopathy or thyromegaly ABDOMEN:  Soft, non-tender, no masses EXTREMITIES:  Moves all extremities well, without clubbing, cyanosis, or edema NEUROLOGIC:  Alert and oriented x 3, normal gait, CN II-XII grossly intact MENTAL STATUS:  appropriate BACK:  Non-tender to palpation, No CVAT SKIN:  Warm, dry, and intact   Results: U/A:  6-10 WBC, >30 RBC, few bacteria  Procedure:  Flexible Cystourethroscopy  Pre-operative Diagnosis: Bladder outlet obstruction  Post-operative Diagnosis: Bladder outlet obstruction  Anesthesia:  local with lidocaine jelly  Surgical Narrative:  After appropriate informed consent was obtained, the patient was prepped and draped in the usual sterile fashion in the supine position.  The patient was correctly identified and the proper procedure delineated prior to proceeding.  Sterile lidocaine gel was instilled in the urethra. The flexible cystoscope was introduced without difficulty.  Findings:  Anterior urethra: Normal  Posterior urethra:  lateral lobe enlargement of prostate , small median lobe with elevation of bladder neck  Bladder:  no significant trabeculations, mucosal edema on posterior bladder wall secondary to foley, no other mucosal lesions  Ureteral orifices: normal  Additional findings: none  Saline bladder wash for cytology was not performed.    The cystoscope was then removed.  The patient tolerated the procedure well.  Procedure:  VOIDING TRIAL  A voiding trial was performed in the office today.   Volume  of sterile water instilled: 250 mL Volume voided by patient: 180 mL Post-void residual by bladder scan: 75 mL Instructed to return to office if has not voided by 4 PM.

## 2021-06-28 NOTE — Progress Notes (Signed)
Fill and Pull Catheter Removal  Patient is present today for a catheter removal.  10 ml of water was removed from the balloon. A 16FR foley cath was removed from the bladder no complications were noted .  Patient was cleaned and prepped in a sterile fashion of saline water was instilled in the bladder during his cysto procedure when the patient felt the urge to urinate.  Patient as then given some time to void on their own.  Patient can void  on their own after some time.  Patient tolerated well.  Performed by: Guss Bunde, CMA  Follow up/ Additional notes: Follow up as scheduled.    post void residual =42mL

## 2021-06-29 ENCOUNTER — Ambulatory Visit (HOSPITAL_COMMUNITY)
Admission: RE | Admit: 2021-06-29 | Discharge: 2021-06-29 | Disposition: A | Discharge status: Home / Self Care | Payer: Medicare PPO | Attending: Internal Medicine | Admitting: Internal Medicine

## 2021-06-29 ENCOUNTER — Encounter (HOSPITAL_COMMUNITY)
Admission: RE | Disposition: A | Discharge status: Home / Self Care | Payer: Self-pay | Source: Home / Self Care | Attending: Internal Medicine

## 2021-06-29 DIAGNOSIS — Z87891 Personal history of nicotine dependence: Secondary | ICD-10-CM | POA: Insufficient documentation

## 2021-06-29 DIAGNOSIS — D509 Iron deficiency anemia, unspecified: Secondary | ICD-10-CM | POA: Diagnosis not present

## 2021-06-29 DIAGNOSIS — K449 Diaphragmatic hernia without obstruction or gangrene: Secondary | ICD-10-CM | POA: Insufficient documentation

## 2021-06-29 DIAGNOSIS — Z86010 Personal history of colonic polyps: Secondary | ICD-10-CM | POA: Diagnosis not present

## 2021-06-29 DIAGNOSIS — D519 Vitamin B12 deficiency anemia, unspecified: Secondary | ICD-10-CM | POA: Diagnosis not present

## 2021-06-29 DIAGNOSIS — K2270 Barrett's esophagus without dysplasia: Secondary | ICD-10-CM | POA: Insufficient documentation

## 2021-06-29 HISTORY — PX: GIVENS CAPSULE STUDY: SHX5432

## 2021-06-29 SURGERY — IMAGING PROCEDURE, GI TRACT, INTRALUMINAL, VIA CAPSULE

## 2021-07-02 NOTE — H&P (Signed)
Ricky [name] is an 86 y.o. male.   Chief Complaint: Patient is here for small bowel given capsule study. HPI: Patient is 86 year old Caucasian male who was recently hospitalized for profound anemia.  His stool was guaiac negative.  He was also found to have B12 deficiency as well.  He underwent diagnostic esophagogastroduodenoscopy followed by colonoscopy but no bleeding lesion was identified.  He received total of 4 units of PRBCs.  He is not returning for small bowel given capsule study looking for source of GI bleeding.  He has not experience hematemesis melena or rectal bleeding.  Past Medical History:  Diagnosis Date   Hypertension        History of CHF.        Iron deficiency anemia.      Recent diagnosis of atrial fibrillation.   Past Surgical History:  Procedure Laterality Date   BIOPSY  05/27/2021   Procedure: BIOPSY;  Surgeon: Rogene Houston, MD;  [facility];  Service: Endoscopy;;   COLONOSCOPY WITH PROPOFOL N/A 05/27/2021   Procedure: COLONOSCOPY WITH PROPOFOL;  Surgeon: Rogene Houston, MD;  [facility];  Service: Endoscopy;  Laterality: N/A;   ESOPHAGOGASTRODUODENOSCOPY (EGD) WITH PROPOFOL N/A 05/27/2021   Procedure: ESOPHAGOGASTRODUODENOSCOPY (EGD) WITH PROPOFOL;  Surgeon: Rogene Houston, MD;  [facility];  Service: Endoscopy;  Laterality: N/A;   KIDNEY STONE SURGERY     NASAL SEPTUM SURGERY     POLYPECTOMY  05/27/2021   Procedure: POLYPECTOMY;  Surgeon: Rogene Houston, MD;  [facility];  Service: Endoscopy;;   SUBMUCOSAL LIFTING INJECTION  05/27/2021   Procedure: SUBMUCOSAL LIFTING INJECTION;  Surgeon: Rogene Houston, MD;  [facility];  Service: Endoscopy;;   TONSILLECTOMY Bilateral     Family History  Problem Relation Age of Onset   Colon cancer Neg Hx    Social History:  reports that he has quit smoking. His smoking use included cigarettes. He has never used smokeless tobacco. He reports that he does  not drink alcohol and does not use drugs.  Allergies:  Allergies  Allergen Reactions   Penicillins     No medications prior to admission.    No results found for this or any previous visit (from the past 48 hour(s)). No results found.  Review of Systems  There were no vitals taken for this visit. Physical Exam   Assessment/Plan  Iron deficiency anemia. No bleeding lesion noted on esophagogastroduodenoscopy as well as colonoscopy. Small bowel given capsule study to look for source of iron deficiency anemia presumed to be due to occult GI bleed.  Hildred Laser, MD 07/02/2021, 3:40 PM

## 2021-07-04 ENCOUNTER — Encounter (HOSPITAL_COMMUNITY): Payer: Self-pay | Admitting: Internal Medicine

## 2021-07-05 ENCOUNTER — Ambulatory Visit: Payer: Medicare PPO | Admitting: Urology

## 2021-07-05 DIAGNOSIS — Z9889 Other specified postprocedural states: Secondary | ICD-10-CM | POA: Diagnosis not present

## 2021-07-05 DIAGNOSIS — D509 Iron deficiency anemia, unspecified: Secondary | ICD-10-CM | POA: Diagnosis not present

## 2021-07-05 NOTE — Op Note (Signed)
Small Bowel Givens Capsule Study Procedure date: 06/29/2021  Referring Provider: Holli Humbles, MD PCP:  Dr. Neale Burly, MD  Indication for procedure:    Patient is 86 year old Caucasian male who hospitalized at [facility] for 6 weeks ago for profound anemia.  His hemoglobin on presentation was 4.9 g.  His stool is guaiac negative.  Anemia was confirmed to be due to iron deficiency as well as B12 deficiency.  He did receive 4 units of PRBCs.  He underwent esophagogastroduodenoscopy and colonoscopy on 05/27/2021.  EGD revealed small sliding hiatal hernia and single tongue of Barrett's esophagus but no bleeding lesion was identified.  2 adenomas were removed from his colon but no bleeding lesion was identified.  He is therefore returning for small bowel given capsule study to complete his GI work-up.   Findings:    Patient was able to swallow given capsule without any difficulty. Study duration 7 hours and  55 minutes.  Studies completed as capsule reached colon. There is coating of small bowel mucosa with blood.  However no lesion identified.  This is seen on image at 9 minutes and 48 seconds possibly in duodenum or proximal jejunum.  Coffee-ground material noted in most of the small bowel starting at 24 minutes and 9 seconds. Examination of distal somewhat limited because of presence of coffee-ground material but there was no mucosal lesion.  First Gastric image: 2 min and 42 sec First Duodenal image: 8 min and 37 sec First Ileo-Cecal Valve image: 3 hrs 44 min and 16 sec First Cecal image: 3 hrs 44 min and 46 sec Gastric Passage time: 5 min and 55 sec Small Bowel Passage time: 3 hrs and 36 min  Summary & Recommendations:  There is small amount of blood coating mucosa duodenum or proximal jejunum without obvious underlying lesion. Coffee-ground material noted starting at approximately 24 minutes all the way down to distal small bowel but not large bowel. Suspect AV malformation or  Dieulafoy lesion. Would recommend push enteroscopy if patient's hemoglobin has dropped again or there is need for blood transfusion.  Please note that I called patient's son Merry Proud yesterday and today but he was not available.

## 2021-07-06 ENCOUNTER — Ambulatory Visit: Payer: Medicare PPO | Admitting: Urology

## 2021-07-06 ENCOUNTER — Encounter: Payer: Self-pay | Admitting: Urology

## 2021-07-06 VITALS — BP 154/63 | HR 76 | Ht 74.0 in | Wt 167.0 lb

## 2021-07-06 DIAGNOSIS — D508 Other iron deficiency anemias: Secondary | ICD-10-CM | POA: Diagnosis not present

## 2021-07-06 DIAGNOSIS — Z87898 Personal history of other specified conditions: Secondary | ICD-10-CM | POA: Diagnosis not present

## 2021-07-06 DIAGNOSIS — R339 Retention of urine, unspecified: Secondary | ICD-10-CM

## 2021-07-06 DIAGNOSIS — N138 Other obstructive and reflux uropathy: Secondary | ICD-10-CM | POA: Diagnosis not present

## 2021-07-06 DIAGNOSIS — N401 Enlarged prostate with lower urinary tract symptoms: Secondary | ICD-10-CM | POA: Diagnosis not present

## 2021-07-06 LAB — URINALYSIS, ROUTINE W REFLEX MICROSCOPIC
Bilirubin, UA: NEGATIVE
Glucose, UA: NEGATIVE
Ketones, UA: NEGATIVE
Leukocytes,UA: NEGATIVE
Nitrite, UA: NEGATIVE
Protein,UA: NEGATIVE
RBC, UA: NEGATIVE
Specific Gravity, UA: 1.01 (ref 1.005–1.030)
Urobilinogen, Ur: 0.2 mg/dL (ref 0.2–1.0)
pH, UA: 5.5 (ref 5.0–7.5)

## 2021-07-06 LAB — BLADDER SCAN AMB NON-IMAGING: Scan Result: 243

## 2021-07-06 NOTE — Progress Notes (Signed)
Assessment: 1. BPH with obstruction/lower urinary tract symptoms   2. Incomplete bladder emptying   3. History of urinary retention    Plan: Continue alfuzosin 10 mg daily.   Timed and double voiding discussed I encouraged the patient to proceed with his GI and cardiac evaluation as these will be necessary should he require surgical management for his BPH and urinary retention. Return to office in 6 weeks with bladder scan  Chief Complaint:  Chief Complaint  Patient presents with   Benign Prostatic Hypertrophy    History of Present Illness:  Ricky [name] is a 86 y.o. year old male who is seen for further evaluation of urinary retention.  He was admitted to the hospital in April 2023 with congestive heart failure.  A Foley catheter was placed at that time and was removed prior to discharge.  He developed urinary retention and had a catheter placed shortly after.  The catheter was left in place for approximately 2 weeks and removed by his PCP on 06/13/2021.  He was recently admitted to [facility] on 06/14/2021 for congestive heart failure and urinary retention.  A Foley catheter was again placed.  He was also found to have a UTI.  Urine culture grew Proteus sensitive to Cipro.  He continued on the Cipro.  He had been on alfuzosin since 05/30/2021. He reported a history of frequency and nocturia 2-3 times for approximately 5 years.  No prior episodes of urinary retention.  He was not on any medical therapy prior to April 2023.  CT imaging from 06/15/2021 showed a decompressed bladder with a Foley catheter in place, circumferential bladder wall thickening with some herniation of the bladder dome into the right inguinal ring, circumferential thickening and enhancement of bilateral distal ureters, no evidence of obstruction, and an enlarged prostate.  He has a prior history of nephrolithiasis undergoing open surgery for stone removal 40 years ago. No prior prostate or bladder  surgery.  Cystoscopy from 06/28/2021 demonstrated lateral lobe enlargement of the prostate with a small median lobe and bladder changes consistent with a indwelling Foley catheter.  He was able to void following cystoscopy.  Postvoid residual was 75 mL. He was continued on alfuzosin.  He returns today for follow-up.  He continues on alfuzosin.  He has been voiding spontaneously since his last visit.  He reports that he is doing "very well".  No dysuria or gross hematuria. IPSS = 5 QOL 2/6 today.  Portions of the above documentation were copied from a prior visit for review purposes only.   Past Medical History:  Past Medical History:  Diagnosis Date   Hypertension     Past Surgical History:  Past Surgical History:  Procedure Laterality Date   BIOPSY  05/27/2021   Procedure: BIOPSY;  Surgeon: Rogene Houston, MD;  [facility];  Service: Endoscopy;;   COLONOSCOPY WITH PROPOFOL N/A 05/27/2021   Procedure: COLONOSCOPY WITH PROPOFOL;  Surgeon: Rogene Houston, MD;  [facility];  Service: Endoscopy;  Laterality: N/A;   ESOPHAGOGASTRODUODENOSCOPY (EGD) WITH PROPOFOL N/A 05/27/2021   Procedure: ESOPHAGOGASTRODUODENOSCOPY (EGD) WITH PROPOFOL;  Surgeon: Rogene Houston, MD;  [facility];  Service: Endoscopy;  Laterality: N/A;   GIVENS CAPSULE STUDY N/A 06/29/2021   Procedure: GIVENS CAPSULE STUDY;  Surgeon: Rogene Houston, MD;  [facility];  Service: Endoscopy;  Laterality: N/A;  [place]  POLYPECTOMY  05/27/2021   Procedure: POLYPECTOMY;  Surgeon: Rogene Houston, MD;  [facility];  Service: Endoscopy;;   SUBMUCOSAL LIFTING INJECTION  05/27/2021   Procedure: SUBMUCOSAL LIFTING INJECTION;  Surgeon: Rogene Houston, MD;  [facility];  Service: Endoscopy;;   TONSILLECTOMY Bilateral     Allergies:  Allergies  Allergen Reactions   Penicillins     Family History:  Family  History  Problem Relation Age of Onset   Colon cancer Neg Hx     Social History:  Social History   Tobacco Use   Smoking status: Former    Types: Cigarettes   Smokeless tobacco: Never  Substance Use Topics   Alcohol use: Never   Drug use: Never    ROS: Constitutional:  Negative for fever, chills, weight loss CV: Negative for chest pain, previous MI, hypertension Respiratory:  Negative for shortness of breath, wheezing, sleep apnea, frequent cough GI:  Negative for nausea, vomiting, bloody stool, GERD  Physical exam: BP (!) 154/63   Pulse 76   Ht 6\' 2"  (1.88 m)   Wt 167 lb (75.8 kg)   BMI 21.44 kg/m  GENERAL APPEARANCE:  Well appearing, well developed, well nourished, NAD HEENT:  Atraumatic, normocephalic, oropharynx clear NECK:  Supple without lymphadenopathy or thyromegaly ABDOMEN:  Soft, non-tender, no masses EXTREMITIES:  Moves all extremities well, without clubbing, cyanosis, or edema NEUROLOGIC:  Alert and oriented x 3, normal gait, CN II-XII grossly intact MENTAL STATUS:  appropriate BACK:  Non-tender to palpation, No CVAT SKIN:  Warm, dry, and intact   Results: U/A dipstick negative  PVR:  243 ml

## 2021-07-06 NOTE — Progress Notes (Signed)
post void residual =259mL

## 2021-07-09 ENCOUNTER — Inpatient Hospital Stay (HOSPITAL_COMMUNITY)
Admission: EM | Admit: 2021-07-09 | Discharge: 2021-07-11 | DRG: 378 | Disposition: A | Discharge status: Home / Self Care | Payer: Medicare PPO | Attending: Internal Medicine | Admitting: Internal Medicine

## 2021-07-09 ENCOUNTER — Other Ambulatory Visit: Payer: Self-pay

## 2021-07-09 ENCOUNTER — Encounter (HOSPITAL_COMMUNITY): Payer: Self-pay | Admitting: *Deleted

## 2021-07-09 DIAGNOSIS — Z79899 Other long term (current) drug therapy: Secondary | ICD-10-CM

## 2021-07-09 DIAGNOSIS — K5909 Other constipation: Secondary | ICD-10-CM | POA: Diagnosis present

## 2021-07-09 DIAGNOSIS — Z7982 Long term (current) use of aspirin: Secondary | ICD-10-CM | POA: Diagnosis not present

## 2021-07-09 DIAGNOSIS — R0609 Other forms of dyspnea: Secondary | ICD-10-CM | POA: Diagnosis not present

## 2021-07-09 DIAGNOSIS — I110 Hypertensive heart disease with heart failure: Secondary | ICD-10-CM | POA: Diagnosis not present

## 2021-07-09 DIAGNOSIS — N138 Other obstructive and reflux uropathy: Secondary | ICD-10-CM | POA: Diagnosis present

## 2021-07-09 DIAGNOSIS — I4891 Unspecified atrial fibrillation: Secondary | ICD-10-CM | POA: Diagnosis not present

## 2021-07-09 DIAGNOSIS — K31811 Angiodysplasia of stomach and duodenum with bleeding: Principal | ICD-10-CM | POA: Diagnosis present

## 2021-07-09 DIAGNOSIS — N1832 Chronic kidney disease, stage 3b: Secondary | ICD-10-CM | POA: Diagnosis not present

## 2021-07-09 DIAGNOSIS — K5900 Constipation, unspecified: Secondary | ICD-10-CM | POA: Diagnosis not present

## 2021-07-09 DIAGNOSIS — I130 Hypertensive heart and chronic kidney disease with heart failure and stage 1 through stage 4 chronic kidney disease, or unspecified chronic kidney disease: Secondary | ICD-10-CM | POA: Diagnosis present

## 2021-07-09 DIAGNOSIS — R933 Abnormal findings on diagnostic imaging of other parts of digestive tract: Secondary | ICD-10-CM | POA: Diagnosis not present

## 2021-07-09 DIAGNOSIS — Z86010 Personal history of colonic polyps: Secondary | ICD-10-CM

## 2021-07-09 DIAGNOSIS — I272 Pulmonary hypertension, unspecified: Secondary | ICD-10-CM | POA: Diagnosis not present

## 2021-07-09 DIAGNOSIS — N401 Enlarged prostate with lower urinary tract symptoms: Secondary | ICD-10-CM | POA: Diagnosis present

## 2021-07-09 DIAGNOSIS — D649 Anemia, unspecified: Principal | ICD-10-CM

## 2021-07-09 DIAGNOSIS — I10 Essential (primary) hypertension: Secondary | ICD-10-CM | POA: Diagnosis present

## 2021-07-09 DIAGNOSIS — K922 Gastrointestinal hemorrhage, unspecified: Secondary | ICD-10-CM | POA: Diagnosis not present

## 2021-07-09 DIAGNOSIS — I5032 Chronic diastolic (congestive) heart failure: Secondary | ICD-10-CM

## 2021-07-09 DIAGNOSIS — Z87891 Personal history of nicotine dependence: Secondary | ICD-10-CM

## 2021-07-09 DIAGNOSIS — I5030 Unspecified diastolic (congestive) heart failure: Secondary | ICD-10-CM | POA: Diagnosis present

## 2021-07-09 DIAGNOSIS — Z87440 Personal history of urinary (tract) infections: Secondary | ICD-10-CM

## 2021-07-09 DIAGNOSIS — D62 Acute posthemorrhagic anemia: Secondary | ICD-10-CM | POA: Diagnosis not present

## 2021-07-09 DIAGNOSIS — I509 Heart failure, unspecified: Secondary | ICD-10-CM | POA: Diagnosis not present

## 2021-07-09 DIAGNOSIS — Z880 Allergy status to penicillin: Secondary | ICD-10-CM | POA: Diagnosis not present

## 2021-07-09 LAB — BASIC METABOLIC PANEL
Anion gap: 5 (ref 5–15)
BUN: 13 mg/dL (ref 8–23)
CO2: 24 mmol/L (ref 22–32)
Calcium: 7.7 mg/dL — ABNORMAL LOW (ref 8.9–10.3)
Chloride: 110 mmol/L (ref 98–111)
Creatinine, Ser: 1.33 mg/dL — ABNORMAL HIGH (ref 0.61–1.24)
GFR, Estimated: 53 mL/min — ABNORMAL LOW (ref >60)
Glucose, Bld: 104 mg/dL — ABNORMAL HIGH (ref 70–99)
Potassium: 3.8 mmol/L (ref 3.5–5.1)
Sodium: 139 mmol/L (ref 135–145)

## 2021-07-09 LAB — CBC
HCT: 23.8 % — ABNORMAL LOW (ref 39.0–52.0)
Hemoglobin: 6.7 g/dL — CL (ref 13.0–17.0)
MCH: 24.5 pg — ABNORMAL LOW (ref 26.0–34.0)
MCHC: 28.2 g/dL — ABNORMAL LOW (ref 30.0–36.0)
MCV: 87.2 fL (ref 80.0–100.0)
Platelets: 348 10*3/uL (ref 150–400)
RBC: 2.73 MIL/uL — ABNORMAL LOW (ref 4.22–5.81)
RDW: 22 % — ABNORMAL HIGH (ref 11.5–15.5)
WBC: 4.2 10*3/uL (ref 4.0–10.5)
nRBC: 0 % (ref 0.0–0.2)

## 2021-07-09 LAB — PROTIME-INR
INR: 1.2 (ref 0.8–1.2)
Prothrombin Time: 14.9 seconds (ref 11.4–15.2)

## 2021-07-09 LAB — CBC WITH DIFFERENTIAL/PLATELET
Abs Immature Granulocytes: 0.01 10*3/uL (ref 0.00–0.07)
Basophils Absolute: 0 10*3/uL (ref 0.0–0.1)
Basophils Relative: 1 %
Eosinophils Absolute: 0.2 10*3/uL (ref 0.0–0.5)
Eosinophils Relative: 4 %
HCT: 25.1 % — ABNORMAL LOW (ref 39.0–52.0)
Hemoglobin: 7.1 g/dL — ABNORMAL LOW (ref 13.0–17.0)
Immature Granulocytes: 0 %
Lymphocytes Relative: 18 %
Lymphs Abs: 0.8 10*3/uL (ref 0.7–4.0)
MCH: 24.7 pg — ABNORMAL LOW (ref 26.0–34.0)
MCHC: 28.3 g/dL — ABNORMAL LOW (ref 30.0–36.0)
MCV: 87.2 fL (ref 80.0–100.0)
Monocytes Absolute: 0.3 10*3/uL (ref 0.1–1.0)
Monocytes Relative: 8 %
Neutro Abs: 3 10*3/uL (ref 1.7–7.7)
Neutrophils Relative %: 69 %
Platelets: 398 10*3/uL (ref 150–400)
RBC: 2.88 MIL/uL — ABNORMAL LOW (ref 4.22–5.81)
RDW: 22.3 % — ABNORMAL HIGH (ref 11.5–15.5)
WBC: 4.3 10*3/uL (ref 4.0–10.5)
nRBC: 0 % (ref 0.0–0.2)

## 2021-07-09 LAB — PREPARE RBC (CROSSMATCH)

## 2021-07-09 LAB — POC OCCULT BLOOD, ED: Fecal Occult Bld: POSITIVE — AB

## 2021-07-09 MED ORDER — FUROSEMIDE 10 MG/ML IJ SOLN
40.0000 mg | Freq: Once | INTRAMUSCULAR | Status: AC
  Administered 2021-07-10: 40 mg via INTRAVENOUS
  Filled 2021-07-09: qty 4

## 2021-07-09 MED ORDER — SODIUM CHLORIDE 0.9% FLUSH
3.0000 mL | Freq: Two times a day (BID) | INTRAVENOUS | Status: DC
  Administered 2021-07-09 – 2021-07-11 (×3): 3 mL via INTRAVENOUS

## 2021-07-09 MED ORDER — SODIUM CHLORIDE 0.9 % IV SOLN: 10.0000 mL/h | Freq: Once | INTRAVENOUS | Status: DC

## 2021-07-09 MED ORDER — ACETAMINOPHEN 650 MG RE SUPP: 650.0000 mg | Freq: Four times a day (QID) | RECTAL | Status: DC | PRN

## 2021-07-09 MED ORDER — ALBUTEROL SULFATE (2.5 MG/3ML) 0.083% IN NEBU: 2.5000 mg | INHALATION_SOLUTION | RESPIRATORY_TRACT | Status: DC | PRN

## 2021-07-09 MED ORDER — PANTOPRAZOLE SODIUM 40 MG IV SOLR
40.0000 mg | Freq: Two times a day (BID) | INTRAVENOUS | Status: DC
  Administered 2021-07-09 – 2021-07-10 (×2): 40 mg via INTRAVENOUS
  Filled 2021-07-09 (×3): qty 10

## 2021-07-09 MED ORDER — SODIUM CHLORIDE 0.9 % IV BOLUS
500.0000 mL | Freq: Once | INTRAVENOUS | Status: AC
  Administered 2021-07-09: 500 mL via INTRAVENOUS

## 2021-07-09 MED ORDER — SODIUM CHLORIDE 0.9% FLUSH: 3.0000 mL | INTRAVENOUS | Status: DC | PRN

## 2021-07-09 MED ORDER — ALFUZOSIN HCL ER 10 MG PO TB24
10.0000 mg | ORAL_TABLET | Freq: Every day | ORAL | Status: DC
  Administered 2021-07-09 – 2021-07-11 (×2): 10 mg via ORAL
  Filled 2021-07-09 (×2): qty 1

## 2021-07-09 MED ORDER — SODIUM CHLORIDE 0.9 % IV SOLN: 250.0000 mL | INTRAVENOUS | Status: DC | PRN

## 2021-07-09 MED ORDER — ACETAMINOPHEN 325 MG PO TABS: 650.0000 mg | ORAL_TABLET | Freq: Four times a day (QID) | ORAL | Status: DC | PRN

## 2021-07-09 NOTE — Consult Note (Signed)
Gastroenterology Consult   Referring Provider: No ref. provider found Primary Care Physician:  Toma Deiters, MD Primary Gastroenterologist:  Dr. Karilyn Cota   Patient ID: Ricky [name]; [id number]; August 24, 1935   Admit date: 07/09/2021  LOS: 0 days   Date of Consultation: 07/09/2021  Reason for Consultation:  GI bleed   History of Present Illness   Ricky [name] is an 86 y.o. year old male with history of profound anemia in April 2023 requiring admission, with Hgb 4.9 at that time. He received transfusions and underwent colonoscopy and EGD while inpatient. EGD revealed Barrett's; colonoscopy with 2 adenomas. Capsule study was then completed on 06/29/21 as outpatient with small amount of blood coating duodenum or proximal jejunum. Suspected AVM or Dieulafoy lesion. Push enteroscopy recommended if Hgb dropped again or need for blood transfusion.   Outside labs by PCP were reportedly low with Hgb 7.1 on Friday. Last documented Hgb in epic was 8 in mid May. Patient was instructed to present to the ED by PCP. Plans for EGD/enteroscopy.   Son is at bedside. Patient is hard of hearing. Patient received call from PCP that Hgb was 7.1 and instructed to report to the ED. Dr. Karilyn Cota made [place] aware and requested EGD/enteroscopy as per plan.   Patient notes last episode of black stool was 3-4 days ago. Main complaint is of fatigue. He was trying to cut down a tree limb today but was unable to complete due to feeling weak. He has no abdominal pain, dysphagia, N/V. He is on 81 mg aspirin. No PPI listed on outpatient med list I reviewed with son. No aspirin powders or other NSAIDs. Denies chest pain. No significant DOE. Chronic lower extremity swelling.     Colonoscopy 05/27/21: anal stricture on DRE. Linear cecal scar, query prior polypectomy or healed ulcer. One small polyp in proximal ascending colon. One 10 by 5 mm polyp at ascending colon s/p infection with appropriate lifting of lesion and removal. 2 clips  placed. Tubular adenoma.  EGD 05/27/21: normal esophagus, normal stomach and duodenum. +Barrett's.       Past Medical History:  Diagnosis Date   Hypertension     Past Surgical History:  Procedure Laterality Date   BIOPSY  05/27/2021   Procedure: BIOPSY;  Surgeon: Malissa Hippo, MD;  [facility];  Service: Endoscopy;;   COLONOSCOPY WITH PROPOFOL N/A 05/27/2021   Procedure: COLONOSCOPY WITH PROPOFOL;  Surgeon: Malissa Hippo, MD;  [facility];  Service: Endoscopy;  Laterality: N/A;   ESOPHAGOGASTRODUODENOSCOPY (EGD) WITH PROPOFOL N/A 05/27/2021   Procedure: ESOPHAGOGASTRODUODENOSCOPY (EGD) WITH PROPOFOL;  Surgeon: Malissa Hippo, MD;  [facility];  Service: Endoscopy;  Laterality: N/A;   GIVENS CAPSULE STUDY N/A 06/29/2021   Procedure: GIVENS CAPSULE STUDY;  Surgeon: Malissa Hippo, MD;  [facility];  Service: Endoscopy;  Laterality: N/A;  730   KIDNEY STONE SURGERY     NASAL SEPTUM SURGERY     POLYPECTOMY  05/27/2021   Procedure: POLYPECTOMY;  Surgeon: Malissa Hippo, MD;  [facility];  Service: Endoscopy;;   SUBMUCOSAL LIFTING INJECTION  05/27/2021   Procedure: SUBMUCOSAL LIFTING INJECTION;  Surgeon: Malissa Hippo, MD;  [facility];  Service: Endoscopy;;   TONSILLECTOMY Bilateral     Prior to Admission medications   Medication Sig Start Date End Date Taking? Authorizing Provider  alfuzosin (UROXATRAL) 10 MG 24 hr tablet Take 10 mg by mouth daily. 05/30/21  [provider]  aspirin EC 81 MG tablet Take 81 mg by mouth daily. Swallow whole.    [provider]  benazepril (LOTENSIN) 5 MG tablet Take 1 tablet (5 mg total) by mouth daily. 05/27/21 06/26/21  Azucena Fallen, MD  carvedilol (COREG) 6.25 MG tablet Take 1 tablet (6.25 mg total) by mouth 2 (two) times daily with a meal. 05/27/21   Azucena Fallen, MD  Chlorpheniramine Maleate (ALLERGY PO) Take 10 mg by mouth daily.     [provider]  ferrous sulfate 325 (65 FE) MG EC tablet Take 1 tablet (325 mg total) by mouth 2 (two) times daily. 05/27/21 05/27/22  Azucena Fallen, MD  folic acid (FOLVITE) 1 MG tablet Take 1 tablet (1 mg total) by mouth daily. 05/27/21   Azucena Fallen, MD  furosemide (LASIX) 20 MG tablet Take 20 mg by mouth daily. 02/26/21   [provider]  potassium chloride (KLOR-CON) 10 MEQ tablet Take 10 mEq by mouth daily.    [provider]  vitamin B-12 (CYANOCOBALAMIN) 250 MCG tablet Take 1 tablet (250 mcg total) by mouth daily. 05/27/21   Azucena Fallen, MD    No current facility-administered medications for this encounter.   Current Outpatient Medications  Medication Sig Dispense Refill   alfuzosin (UROXATRAL) 10 MG 24 hr tablet Take 10 mg by mouth daily.     aspirin EC 81 MG tablet Take 81 mg by mouth daily. Swallow whole.     benazepril (LOTENSIN) 5 MG tablet Take 1 tablet (5 mg total) by mouth daily. 30 tablet 0   carvedilol (COREG) 6.25 MG tablet Take 1 tablet (6.25 mg total) by mouth 2 (two) times daily with a meal. 60 tablet 0   Chlorpheniramine Maleate (ALLERGY PO) Take 10 mg by mouth daily.     ferrous sulfate 325 (65 FE) MG EC tablet Take 1 tablet (325 mg total) by mouth 2 (two) times daily. 60 tablet 3   folic acid (FOLVITE) 1 MG tablet Take 1 tablet (1 mg total) by mouth daily. 60 tablet 0   furosemide (LASIX) 20 MG tablet Take 20 mg by mouth daily.     potassium chloride (KLOR-CON) 10 MEQ tablet Take 10 mEq by mouth daily.     vitamin B-12 (CYANOCOBALAMIN) 250 MCG tablet Take 1 tablet (250 mcg total) by mouth daily. 60 tablet 0    Allergies as of 07/09/2021 - Review Complete 07/09/2021  Allergen Reaction Noted   Penicillins  05/23/2021    Family History  Problem Relation Age of Onset   Colon cancer Neg Hx     Social History   Socioeconomic History   Marital status: Widowed    Spouse name: Not on file   Number of children: Not  on file   Years of education: Not on file   Highest education level: Not on file  Occupational History   Not on file  Tobacco Use   Smoking status: Former    Types: Cigarettes   Smokeless tobacco: Never  Substance and Sexual Activity   Alcohol use: Never   Drug use: Never   Sexual activity: Not on file  Other Topics Concern   Not on file  Social History Narrative   Not on file   Social Determinants of Health   Financial Resource Strain: Not on file  Food Insecurity: Not on file  Transportation Needs: Not on file  Physical Activity: Not on file  Stress: Not on file  Social  Connections: Not on file  Intimate Partner Violence: Not on file     Review of Systems   As mentioned in HPI  Physical Exam   Vital Signs in last 24 hours: Temp:  [98.1 F (36.7 C)] 98.1 F (36.7 C) (06/05 1508) Pulse Rate:  [81-90] 85 (06/05 1600) Resp:  [16-18] 18 (06/05 1600) BP: (134-140)/(48-60) 136/60 (06/05 1600) SpO2:  [97 %-98 %] 97 % (06/05 1600) Weight:  [78.2 kg] 78.2 kg (06/05 1507)    General:   Alert,  Well-developed, well-nourished, pale Head:  Normocephalic and atraumatic. Eyes:  Sclera clear, no icterus.    Ears:  hard of hearing Mouth:  No deformity or lesions, dentition normal. Lungs:  Clear throughout to auscultation.    Heart:  S1 S2 present, question systolic murmur Abdomen:  Soft, nontender and nondistended. No masses, hepatosplenomegaly or hernias noted. Normal bowel sounds, without guarding, and without rebound.   Rectal: deferred   Msk:  Symmetrical without gross deformities. Normal posture. Extremities:  With marked pedal/ankle edema. 2-3+ pitting lower extremity edema  Neurologic:  Alert and  oriented x4. Skin:  Intact without significant lesions or rashes. Psych:  Alert and cooperative. Normal mood and affect.  Intake/Output from previous day: No intake/output data recorded. Intake/Output this shift: No intake/output data recorded.   Labs/Studies    Lab Results  Component Value Date   WBC 4.3 07/09/2021   HGB 7.1 (L) 07/09/2021   HCT 25.1 (L) 07/09/2021   MCV 87.2 07/09/2021   PLT 398 07/09/2021       Radiology/Studies No results found.   Assessment   Ricky CrowJames R [name] is an 86 y.o. year old male with history of profound anemia in April 2023 requiring admission, with Hgb 4.9 at that time, receiving transfusions and undergoing both EGD/colonoscopy that were unrevealing. Capsule study as outpatient with small amount of blood in duodenum, possible proximal jejunum, with suspected AVM or Dieulafoy lesion as culprit. Presenting now with acute on chronic anemia and reports of melena.  Acute blood loss anemia: Hgb 7.1 on Friday through PCP and patient notified to present here, as last Hgb was in 8 range. Symptomatic anemia noted. Interestingly, Hgb today is 7.1 and stable. Denies any NSAIDs, aspirin powders other than 81 mg aspirin daily. On no anticoagulation. Suspected small bowel source as per above. Plans for EGD with enteroscopy with attempts at localizing source. Last episode of melena 3-4 days ago. He notes stuttering melena.     Plan / Recommendations    Full liquids NPO after midnight EGD with push enteroscopy on 6/6 by Dr. Levon Hedgerastaneda. Discussed risks and benefits with patient and son.  Follow H/H, transfuse as needed PPI IV BID     07/09/2021, 4:26 PM  Gelene MinkAnna W. Gizella [place], PhD, [facility] ServicesNP-BC [facility]

## 2021-07-09 NOTE — Progress Notes (Signed)
Pt arrived to room #309 from ED via [place]. Pt ambulatory from chair to bed with standby assist, tolerated well, slight dyspnea with exertion. Pt is very HOH but alert and oriented x4. Pt and son oriented to safety procedures, both state understanding. Call bell within reach, bed alarm on for safety.

## 2021-07-09 NOTE — ED Triage Notes (Signed)
Dr Karilyn Cota sent pt here for Dr. Marletta Lor for abnormal hbg level.

## 2021-07-09 NOTE — H&P (Signed)
History and Physical    Patient: Ricky [name] DOB: 1935/11/24 DOA: 07/09/2021 DOS: the patient was seen and examined on 07/09/2021 PCP: Neale Burly, MD  Patient coming from: Home  Chief Complaint:  Chief Complaint  Patient presents with   Abnormal Lab   HPI: Ricky [name] is a 86 y.o. male with medical history significant of HTN, HFpEF who was recently hospitalized from 4/19-4/23 for decompensated diastolic heart failure-during that admission-patient was found to be profoundly anemic requiring several units of PRBC transfusion-he had a EGD/colonoscopy (EGD revealed Barrett's, colonoscopy-2 adenomas) done that did not show any acute bleeding foci.  Patient was stabilized and discharged home.  He had a outpatient capsule endoscopy done on 5/26 which showed blood coating in the duodenum/proximal jejunum-and coffee-ground material in the distal small bowel-patient was suspected to have a AVM-subsequently outpatient labs were checked by PCP which showed a hemoglobin of 7.1-patient was then asked to go to the emergency room for inpatient evaluation.  Patient does acknowledge intermittent melanotic stools over the past few days-but this has been ongoing for the past 1 month.  He also acknowledges exertional dyspnea, ongoing lower extremity edema.  He does not however have chest pain.  Denies any abdominal pain.  No fever, headache, nausea, vomiting or diarrhea.   Review of Systems: As mentioned in the history of present illness. All other systems reviewed and are negative. Past Medical History:  Diagnosis Date   Hypertension    Past Surgical History:  Procedure Laterality Date   BIOPSY  05/27/2021   Procedure: BIOPSY;  Surgeon: Rogene Houston, MD;  [facility];  Service: Endoscopy;;   COLONOSCOPY WITH PROPOFOL N/A 05/27/2021   Procedure: COLONOSCOPY WITH PROPOFOL;  Surgeon: Rogene Houston, MD;  [facility];  Service: Endoscopy;  Laterality: N/A;    ESOPHAGOGASTRODUODENOSCOPY (EGD) WITH PROPOFOL N/A 05/27/2021   Procedure: ESOPHAGOGASTRODUODENOSCOPY (EGD) WITH PROPOFOL;  Surgeon: Rogene Houston, MD;  [facility];  Service: Endoscopy;  Laterality: N/A;   GIVENS CAPSULE STUDY N/A 06/29/2021   Procedure: GIVENS CAPSULE STUDY;  Surgeon: Rogene Houston, MD;  [facility];  Service: Endoscopy;  Laterality: N/A;  [place]     NASAL SEPTUM SURGERY     POLYPECTOMY  05/27/2021   Procedure: POLYPECTOMY;  Surgeon: Rogene Houston, MD;  [facility];  Service: Endoscopy;;   SUBMUCOSAL LIFTING INJECTION  05/27/2021   Procedure: SUBMUCOSAL LIFTING INJECTION;  Surgeon: Rogene Houston, MD;  [facility];  Service: Endoscopy;;   TONSILLECTOMY Bilateral    Social History:  reports that he has quit smoking. His smoking use included cigarettes. He has never used smokeless tobacco. He reports that he does not drink alcohol and does not use drugs.  Allergies  Allergen Reactions   Penicillins     Family History  Problem Relation Age of Onset   Colon cancer Neg Hx     Prior to Admission medications   Medication Sig Start Date End Date Taking? Authorizing Provider  alfuzosin (UROXATRAL) 10 MG 24 hr tablet Take 10 mg by mouth daily. 05/30/21   [provider]  aspirin EC 81 MG tablet Take 81 mg by mouth daily. Swallow whole.    [provider]  benazepril (LOTENSIN) 5 MG tablet Take 1 tablet (5 mg total) by mouth daily. 05/27/21 06/26/21  Little Ishikawa, MD  carvedilol (COREG) 6.25 MG tablet Take 1 tablet (6.25  mg total) by mouth 2 (two) times daily with a meal. 05/27/21   Little Ishikawa, MD  Chlorpheniramine Maleate (ALLERGY PO) Take 10 mg by mouth daily.    [provider]  ferrous sulfate 325 (65 FE) MG EC tablet Take 1 tablet (325 mg total) by mouth 2 (two) times daily. 05/27/21 05/27/22  Little Ishikawa, MD  folic acid (FOLVITE) 1 MG tablet Take 1  tablet (1 mg total) by mouth daily. 05/27/21   Little Ishikawa, MD  furosemide (LASIX) 20 MG tablet Take 20 mg by mouth daily. 02/26/21   [provider]  potassium chloride (KLOR-CON) 10 MEQ tablet Take 10 mEq by mouth daily.    [provider]  vitamin B-12 (CYANOCOBALAMIN) 250 MCG tablet Take 1 tablet (250 mcg total) by mouth daily. 05/27/21   Little Ishikawa, MD    Physical Exam: Vitals:   07/09/21 1545 07/09/21 1600 07/09/21 1630 07/09/21 1700  BP: (!) 134/48 136/60 (!) 100/58 (!) 126/54  Pulse: 81 85 92 70  Resp: 17 18 18 17   Temp:      TempSrc:      SpO2: 97% 97% 98% 98%  Weight:      Height:       Gen Exam:Alert awake-not in any distress HEENT:atraumatic, normocephalic Chest: B/L clear to auscultation anteriorly CVS:S1S2 regular Abdomen:soft non tender, non distended Extremities:2+edema Neurology: Non focal Skin: no rash     Data Reviewed:    Latest Ref Rng & Units 07/09/2021    3:34 PM 05/27/2021    6:01 AM 05/26/2021    4:34 AM  CBC  WBC 4.0 - 10.5 K/uL 4.3   6.3   6.9    Hemoglobin 13.0 - 17.0 g/dL 7.1   8.6   8.1    Hematocrit 39.0 - 52.0 % 25.1   30.0   29.0    Platelets 150 - 400 K/uL 398   346   378        Latest Ref Rng & Units 07/09/2021    3:34 PM 05/27/2021    6:01 AM 05/26/2021    4:34 AM  BMP  Glucose 70 - 99 mg/dL 104   96   95    BUN 8 - 23 mg/dL 13   20   22     Creatinine 0.61 - 1.24 mg/dL 1.33   1.46   1.69    Sodium 135 - 145 mmol/L 139   139   137    Potassium 3.5 - 5.1 mmol/L 3.8   3.4   3.7    Chloride 98 - 111 mmol/L 110   104   101    CO2 22 - 32 mmol/L 24   29   30     Calcium 8.9 - 10.3 mg/dL 7.7   8.2   7.8      Assessment and Plan: Small bowel bleeding with acute on chronic blood loss anemia: Appreciate GI input-suspected AVM etiology.  Full liquid diet for today-n.p.o. postmidnight-GI tentatively planning for a small bowel push enteroscopy tomorrow morning.  Recommendations are to keep on PPI twice daily.   Given underlying cardiac history-ongoing GI bleeding-we will go ahead and transfuse 1 unit of PRBC.  Chronic HFpEF: Has chronic lower extremity edema (per son better than at prior hospitalization)-we will keep on diuretics while hospitalized.  Watch closely.  HTN: Hold Coreg/benazepril in an attempt to allow more room for diuresis.  Resume when able.  CKD stage IIIb: Creatinine at baseline-watch  closely.  BPH: Continue alfuzosin   Advance Care Planning:   Code Status: Full  Consults: GI  Family Communication: son @ bedside  Severity of Illness: The appropriate patient status for this patient is INPATIENT. Inpatient status is judged to be reasonable and necessary in order to provide the required intensity of service to ensure the patient's safety. The patient's presenting symptoms, physical exam findings, and initial radiographic and laboratory data in the context of their chronic comorbidities is felt to place them at high risk for further clinical deterioration. Furthermore, it is not anticipated that the patient will be medically stable for discharge from the hospital within 2 midnights of admission.   * I certify that at the point of admission it is my clinical judgment that the patient will require inpatient hospital care spanning beyond 2 midnights from the point of admission due to high intensity of service, high risk for further deterioration and high frequency of surveillance required.*  Author: Oren Binet, MD 07/09/2021 6:04 PM  For on call review [email or web address].

## 2021-07-09 NOTE — ED Provider Notes (Signed)
[facility] EMERGENCY DEPARTMENT Provider Note   CSN: [id number] Arrival date & time: 07/09/21  1452     History  Chief Complaint  Patient presents with   Abnormal Lab    Ricky [name] is a 86 y.o. male.  HPI  Medical history including CHF, hypertension, BPH, anemia presents  with complaints of abnormal lab work.  Patient states that he was sent over by Dr. Laural Golden for low hemoglobin.  Patient states that over the last couple days he is felt more fatigued, states he feels generalized weakness not unilateral, no headaches no change in vision no numbness or tingling arms or legs, no recent head trauma, not on anticoag's, denies any stomach pains nausea vomiting diarrhea no URI-like symptoms, he denies melena or hematochezia denies any urinary symptoms.  Son at bedside able to validate the story.  Reviewed patient's chart was admitted in April for congestive heart failure noted to have anemia, he was given 4 units of blood, he had endoscopy as well as colonoscopy which were unremarkable, patient is currently being followed by Dr. Melony Overly, capsule study was unremarkable.  Home Medications Prior to Admission medications   Medication Sig Start Date End Date Taking? Authorizing Provider  alfuzosin (UROXATRAL) 10 MG 24 hr tablet Take 10 mg by mouth daily. 05/30/21   [provider]  aspirin EC 81 MG tablet Take 81 mg by mouth daily. Swallow whole.    [provider]  benazepril (LOTENSIN) 5 MG tablet Take 1 tablet (5 mg total) by mouth daily. 05/27/21 06/26/21  Little Ishikawa, MD  carvedilol (COREG) 6.25 MG tablet Take 1 tablet (6.25 mg total) by mouth 2 (two) times daily with a meal. 05/27/21   Little Ishikawa, MD  Chlorpheniramine Maleate (ALLERGY PO) Take 10 mg by mouth daily.    [provider]  ferrous sulfate 325 (65 FE) MG EC tablet Take 1 tablet (325 mg total) by mouth 2 (two) times daily. 05/27/21 05/27/22  Little Ishikawa, MD  folic acid (FOLVITE) 1  MG tablet Take 1 tablet (1 mg total) by mouth daily. 05/27/21   Little Ishikawa, MD  furosemide (LASIX) 20 MG tablet Take 20 mg by mouth daily. 02/26/21   [provider]  potassium chloride (KLOR-CON) 10 MEQ tablet Take 10 mEq by mouth daily.    [provider]  vitamin B-12 (CYANOCOBALAMIN) 250 MCG tablet Take 1 tablet (250 mcg total) by mouth daily. 05/27/21   Little Ishikawa, MD      Allergies    Penicillins    Review of Systems   Review of Systems  Constitutional:  Positive for fever. Negative for chills.  Respiratory:  Negative for shortness of breath.   Cardiovascular:  Negative for chest pain.  Gastrointestinal:  Negative for abdominal pain.  Neurological:  Negative for headaches.   Physical Exam Updated Vital Signs BP (!) 126/54   Pulse 70   Temp 98.1 F (36.7 C) (Oral)   Resp 17   Ht 6\' 2"  (1.88 m)   Wt 78.2 kg   SpO2 98%   BMI 22.15 kg/m  Physical Exam Vitals and nursing note reviewed. Exam conducted with a chaperone present.  Constitutional:      General: He is not in acute distress.    Appearance: He is not ill-appearing.  HENT:     Head: Normocephalic and atraumatic.     Nose: No congestion.  Eyes:     Conjunctiva/sclera: Conjunctivae normal.     Comments:  Pale conjunctive   Cardiovascular:     Rate and Rhythm: Normal rate and regular rhythm.     Pulses: Normal pulses.     Heart sounds: No murmur heard.   No friction rub. No gallop.  Pulmonary:     Effort: No respiratory distress.     Breath sounds: No wheezing, rhonchi or rales.  Abdominal:     Palpations: Abdomen is soft.     Tenderness: There is no abdominal tenderness. There is no right CVA tenderness or left CVA tenderness.  Genitourinary:    Comments: Rectal exam was performed, no noted external hemorrhoids, no blood noted around the external anus, digital rectal exam was performed no palpable mass or stool burden present, Hemoccult was positive without frank melena or  hematochezia Skin:    General: Skin is warm and dry.  Neurological:     Mental Status: He is alert.  Psychiatric:        Mood and Affect: Mood normal.    ED Results / Procedures / Treatments   Labs (all labs ordered are listed, but only abnormal results are displayed) Labs Reviewed  CBC WITH DIFFERENTIAL/PLATELET - Abnormal; Notable for the following components:      Result Value   RBC 2.88 (*)    Hemoglobin 7.1 (*)    HCT 25.1 (*)    MCH 24.7 (*)    MCHC 28.3 (*)    RDW 22.3 (*)    All other components within normal limits  BASIC METABOLIC PANEL - Abnormal; Notable for the following components:   Glucose, Bld 104 (*)    Creatinine, Ser 1.33 (*)    Calcium 7.7 (*)    GFR, Estimated 53 (*)    All other components within normal limits  POC OCCULT BLOOD, ED - Abnormal; Notable for the following components:   Fecal Occult Bld POSITIVE (*)    All other components within normal limits  PROTIME-INR  TYPE AND SCREEN  PREPARE RBC (CROSSMATCH)    EKG EKG Interpretation  Date/Time:  Monday July 09 2021 17:28:13 EDT Ventricular Rate:  75 PR Interval:  167 QRS Duration: 144 QT Interval:  442 QTC Calculation: 494 R Axis:   43 Text Interpretation: Sinus rhythm Right bundle branch block Anteroseptal infarct, age indeterminate Confirmed by Milton Ferguson [phone]) on 07/09/2021 6:00:44 PM  Radiology No results found.  Procedures .Critical Care Performed by: Marcello Fennel, PA-C Authorized by: Marcello Fennel, PA-C   Critical care provider statement:    Critical care time (minutes):  30   Critical care time was exclusive of:  Separately billable procedures and treating other patients   Critical care was necessary to treat or prevent imminent or life-threatening deterioration of the following conditions:  Circulatory failure   Critical care was time spent personally by me on the following activities:  Development of treatment plan with patient or surrogate, discussions  with consultants, evaluation of patient's response to treatment, examination of patient, ordering and review of laboratory studies, ordering and review of radiographic studies, ordering and performing treatments and interventions, pulse oximetry, re-evaluation of patient's condition and review of old charts   I assumed direction of critical care for this patient from another provider in my specialty: no     Care discussed with: admitting provider      Medications Ordered in ED Medications  pantoprazole (PROTONIX) injection 40 mg (40 mg Intravenous Given 07/09/21 1729)  0.9 %  sodium chloride infusion (has no administration in time range)  furosemide (  LASIX) injection 40 mg (has no administration in time range)  sodium chloride 0.9 % bolus 500 mL (500 mLs Intravenous New Bag/Given 07/09/21 1729)    ED Course/ Medical Decision Making/ A&P                           Medical Decision Making Amount and/or Complexity of Data Reviewed Labs: ordered.  Risk Prescription drug management. Decision regarding hospitalization.   This patient presents to the ED for concern of low hemoglobin, this involves an extensive number of treatment options, and is a complaint that carries with it a high risk of complications and morbidity.  The differential diagnosis includes anemia, GI bleed, bleeding disorder    Additional history obtained:  Additional history obtained from son at bedside External records from outside source obtained and reviewed including previous GI notes, procedural notes   Co morbidities that complicate the patient evaluation  CHF  Social Determinants of Health:  Geriatric    Lab Tests:  I Ordered, and personally interpreted labs.  The pertinent results include: CBC shows normocytic anemia hemoglobin of 7.1, on April 16 it was 8.0, BMP shows glucose of 104 creatinine 1.33 calcium 7.7 GFR 53 Hemoccult positive INR unremarkable   Imaging Studies ordered:  I ordered imaging  studies including N/A I independently visualized and interpreted imaging which showed N/A I agree with the radiologist interpretation   Cardiac Monitoring:  The patient was maintained on a cardiac monitor.  I personally viewed and interpreted the cardiac monitored which showed an underlying rhythm of: Without signs of ischemia   Medicines ordered and prescription drug management:  I ordered medication including fluids, Protonix I have reviewed the patients home medicines and have made adjustments as needed  Critical Interventions:  Hemoglobin is 7.1 endorsing weakness, will transfuse patient for symptomatic anemia   Reevaluation:  Presents with low hemoglobin, he had a benign physical exam, he was Hemoccult positive without frank melena or hematochezia.  He hemodynamically stable.  will consult with GI for further evaluation  Hemoglobin is 7.1, has dropped over the last 2 weeks, endorsing weakness will transfuse patient.    Updated patient on recommendation from GI she is agreement this plan will consult hospitalist team for admission.   Consultations Obtained:  I requested consultation with the gastroenterology Dr. Abbey Chatters,  and discussed lab and imaging findings as well as pertinent plan - they recommend: Hospital admission, n.p.o. by midnight tonight, plan on EDG tomorrow. Spoke with Dr. Evalee Mutton who will admit the patient.    Test Considered:  CT on pelvis-deferred my suspicion for bowel obstruction/intra-abdominal infection is very low at this time nonsurgical abdomen no evidence of infection seen lab work.     Rule out Low suspicion for CVA or internal head bleed denies any headaches, change in vision, paresthesia or weakness of the lower extremities, there is no focal deficit on my exam, he is not on anticoag's.  Low suspicion for ACS denies any chest pain or shortness of breath, EKG without signs of ischemia.  Endorses overall weakness likely secondary due to anemia.   I have low suspicion for bowel obstruction denies any nausea vomiting still tolerant p.o. nondistended abdomen still passing gas abnormal bowel movements.      Dispostion and problem list  After consideration of the diagnostic results and the patients response to treatment, I feel that the patent would benefit from admission.  Symptomatic anemia-likely secondary due to lower GI bleed, receiving a  unit of blood in the ER, with Lasix provided afterwards due to diastolic heart failure, will undergo EGD tomorrow for further evaluation             Final Clinical Impression(s) / ED Diagnoses Final diagnoses:  Symptomatic anemia    Rx / DC Orders ED Discharge Orders     None         Marcello Fennel, PA-C 07/09/21 1805    Milton Ferguson, MD 07/10/21 [phone]

## 2021-07-10 ENCOUNTER — Inpatient Hospital Stay (HOSPITAL_COMMUNITY): Payer: Medicare PPO | Admitting: Anesthesiology

## 2021-07-10 ENCOUNTER — Encounter (HOSPITAL_COMMUNITY)
Admission: EM | Disposition: A | Discharge status: Home / Self Care | Payer: Self-pay | Source: Home / Self Care | Attending: Internal Medicine

## 2021-07-10 ENCOUNTER — Encounter (HOSPITAL_COMMUNITY): Payer: Self-pay | Admitting: Internal Medicine

## 2021-07-10 ENCOUNTER — Other Ambulatory Visit: Payer: Self-pay

## 2021-07-10 ENCOUNTER — Encounter (INDEPENDENT_AMBULATORY_CARE_PROVIDER_SITE_OTHER): Payer: Self-pay

## 2021-07-10 DIAGNOSIS — Z87891 Personal history of nicotine dependence: Secondary | ICD-10-CM

## 2021-07-10 DIAGNOSIS — D649 Anemia, unspecified: Principal | ICD-10-CM

## 2021-07-10 DIAGNOSIS — R933 Abnormal findings on diagnostic imaging of other parts of digestive tract: Secondary | ICD-10-CM

## 2021-07-10 DIAGNOSIS — N1832 Chronic kidney disease, stage 3b: Secondary | ICD-10-CM | POA: Diagnosis not present

## 2021-07-10 DIAGNOSIS — I5032 Chronic diastolic (congestive) heart failure: Secondary | ICD-10-CM | POA: Diagnosis not present

## 2021-07-10 DIAGNOSIS — I110 Hypertensive heart disease with heart failure: Secondary | ICD-10-CM

## 2021-07-10 DIAGNOSIS — D62 Acute posthemorrhagic anemia: Secondary | ICD-10-CM

## 2021-07-10 DIAGNOSIS — K31811 Angiodysplasia of stomach and duodenum with bleeding: Principal | ICD-10-CM

## 2021-07-10 DIAGNOSIS — I509 Heart failure, unspecified: Secondary | ICD-10-CM

## 2021-07-10 HISTORY — PX: HOT HEMOSTASIS: SHX5433

## 2021-07-10 HISTORY — PX: ESOPHAGOGASTRODUODENOSCOPY (EGD) WITH PROPOFOL: SHX5813

## 2021-07-10 LAB — CBC
HCT: 26.7 % — ABNORMAL LOW (ref 39.0–52.0)
HCT: 27.1 % — ABNORMAL LOW (ref 39.0–52.0)
HCT: 26.1 % — ABNORMAL LOW (ref 39.0–52.0)
Hemoglobin: 7.8 g/dL — ABNORMAL LOW (ref 13.0–17.0)
Hemoglobin: 7.9 g/dL — ABNORMAL LOW (ref 13.0–17.0)
Hemoglobin: 7.6 g/dL — ABNORMAL LOW (ref 13.0–17.0)
MCH: 25 pg — ABNORMAL LOW (ref 26.0–34.0)
MCH: 24.8 pg — ABNORMAL LOW (ref 26.0–34.0)
MCH: 24.8 pg — ABNORMAL LOW (ref 26.0–34.0)
MCHC: 29.2 g/dL — ABNORMAL LOW (ref 30.0–36.0)
MCHC: 29.2 g/dL — ABNORMAL LOW (ref 30.0–36.0)
MCHC: 29.1 g/dL — ABNORMAL LOW (ref 30.0–36.0)
MCV: 85.6 fL (ref 80.0–100.0)
MCV: 85.2 fL (ref 80.0–100.0)
MCV: 85.3 fL (ref 80.0–100.0)
Platelets: 367 10*3/uL (ref 150–400)
Platelets: 353 10*3/uL (ref 150–400)
Platelets: 328 10*3/uL (ref 150–400)
RBC: 3.12 MIL/uL — ABNORMAL LOW (ref 4.22–5.81)
RBC: 3.18 MIL/uL — ABNORMAL LOW (ref 4.22–5.81)
RBC: 3.06 MIL/uL — ABNORMAL LOW (ref 4.22–5.81)
RDW: 22.5 % — ABNORMAL HIGH (ref 11.5–15.5)
RDW: 22.3 % — ABNORMAL HIGH (ref 11.5–15.5)
RDW: 22.5 % — ABNORMAL HIGH (ref 11.5–15.5)
WBC: 4.4 10*3/uL (ref 4.0–10.5)
WBC: 4.4 10*3/uL (ref 4.0–10.5)
WBC: 4.4 10*3/uL (ref 4.0–10.5)
nRBC: 0 % (ref 0.0–0.2)
nRBC: 0 % (ref 0.0–0.2)
nRBC: 0 % (ref 0.0–0.2)

## 2021-07-10 LAB — BASIC METABOLIC PANEL
Anion gap: 4 — ABNORMAL LOW (ref 5–15)
BUN: 13 mg/dL (ref 8–23)
CO2: 28 mmol/L (ref 22–32)
Calcium: 7.9 mg/dL — ABNORMAL LOW (ref 8.9–10.3)
Chloride: 109 mmol/L (ref 98–111)
Creatinine, Ser: 1.13 mg/dL (ref 0.61–1.24)
GFR, Estimated: >60 mL/min (ref >60)
Glucose, Bld: 88 mg/dL (ref 70–99)
Potassium: 3.6 mmol/L (ref 3.5–5.1)
Sodium: 141 mmol/L (ref 135–145)

## 2021-07-10 LAB — TYPE AND SCREEN
ABO/RH(D): A POS
Antibody Screen: NEGATIVE
Unit division: 0
Unit division: 0

## 2021-07-10 LAB — BPAM RBC
Blood Product Expiration Date: 202306052359
Blood Product Expiration Date: 202307062359
ISSUE DATE / TIME: 202306052045
Unit Type and Rh: 600
Unit Type and Rh: 6200

## 2021-07-10 SURGERY — ESOPHAGOGASTRODUODENOSCOPY (EGD) WITH PROPOFOL
Anesthesia: General

## 2021-07-10 MED ORDER — FUROSEMIDE 20 MG PO TABS
20.0000 mg | ORAL_TABLET | Freq: Every day | ORAL | Status: DC
  Administered 2021-07-11: 20 mg via ORAL
  Filled 2021-07-10: qty 1

## 2021-07-10 MED ORDER — PROPOFOL 10 MG/ML IV BOLUS
INTRAVENOUS | Status: DC | PRN
  Administered 2021-07-10: 70 mg via INTRAVENOUS
  Administered 2021-07-10: 30 mg via INTRAVENOUS
  Administered 2021-07-10 (×2): 20 mg via INTRAVENOUS
  Administered 2021-07-10: 30 mg via INTRAVENOUS

## 2021-07-10 MED ORDER — LACTATED RINGERS IV SOLN: INTRAVENOUS | Status: DC | PRN

## 2021-07-10 MED ORDER — GLUCAGON HCL RDNA (DIAGNOSTIC) 1 MG IJ SOLR
INTRAMUSCULAR | Status: DC | PRN
  Administered 2021-07-10: .5 mg via INTRAVENOUS

## 2021-07-10 MED ORDER — CARVEDILOL 3.125 MG PO TABS
6.2500 mg | ORAL_TABLET | Freq: Two times a day (BID) | ORAL | Status: DC
  Administered 2021-07-10 – 2021-07-11 (×2): 6.25 mg via ORAL
  Filled 2021-07-10 (×2): qty 2

## 2021-07-10 MED ORDER — SODIUM CHLORIDE 0.9 % IV SOLN: INTRAVENOUS | Status: DC

## 2021-07-10 NOTE — Progress Notes (Signed)
We will proceed with push enteroscopy as scheduled.  I thoroughly discussed with the patient his procedure, including the risks involved. Patient understands what the procedure involves including the benefits and any risks. Patient understands alternatives to the proposed procedure. Risks including (but not limited to) bleeding, tearing of the lining (perforation), rupture of adjacent organs, problems with heart and lung function, infection, and medication reactions. A small percentage of complications may require surgery, hospitalization, repeat endoscopic procedure, and/or transfusion.  Patient understood and agreed.  Ricky Fifer Castaneda, MD Gastroenterology and Hepatology [facility]  

## 2021-07-10 NOTE — Anesthesia Postprocedure Evaluation (Signed)
Anesthesia Post Note  Patient: Ricky [name]  Procedure(s) Performed: ESOPHAGOGASTRODUODENOSCOPY (EGD) WITH PROPOFOL HOT HEMOSTASIS (ARGON PLASMA COAGULATION/BICAP)  Patient location during evaluation: PACU Anesthesia Type: General Level of consciousness: awake Pain management: pain level controlled Vital Signs Assessment: post-procedure vital signs reviewed and stable Respiratory status: spontaneous breathing Cardiovascular status: blood pressure returned to baseline and stable Postop Assessment: no apparent nausea or vomiting Anesthetic complications: no   No notable events documented.   Last Vitals:  Vitals:   07/10/21 0651 07/10/21 0827  BP: (!) 143/77 (!) 142/55  Pulse: 73 74  Resp: 18 17  Temp: 36.7 C 37.1 C  SpO2: 96% 97%    Last Pain:  Vitals:   07/10/21 0827  TempSrc: Oral  PainSc: 0-No pain                 Graylyn Bunney

## 2021-07-10 NOTE — Brief Op Note (Signed)
07/09/2021 - 07/10/2021  12:42 PM  PATIENT:  Ricky [name]  86 y.o. male  PRE-OPERATIVE DIAGNOSIS:  UGI bleed, acute blood loss anemia, melena  POST-OPERATIVE DIAGNOSIS:  AVMs times 4  PROCEDURE:  Procedure(s) with comments: ESOPHAGOGASTRODUODENOSCOPY (EGD) WITH PROPOFOL (N/A) - with enteroscopy HOT HEMOSTASIS (ARGON PLASMA COAGULATION/BICAP)  SURGEON:  Surgeon(s) and Role:    * Harvel Quale, MD - Primary  Patient underwent push enteroscopy under propofol sedation.  Tolerated the procedure adequately.  Esophagus and stomach were normal. Four angiodysplastic lesions with stigmata of recent bleeding were found in the second portion of the duodenum and in the fourth portion of the duodenum. there was presence of scant amount of fresh blood in proximity to these lesions but no ongoing bleeding source was identified wihen the blood was washed. Coagulation for bleeding prevention using argon plasma at 0.3 liters/minute and 20 watts was successful.  There was no evidence of significant pathology in the proximal jejunum.   RECOMMENDATIONS - Return patient to hospital ward for ongoing care.  - Full liquid diet today.  - Check H/H every day  Maylon Peppers, MD Gastroenterology and Hepatology [facility]

## 2021-07-10 NOTE — Hospital Course (Addendum)
86 y.o. male with medical history significant of HTN, HFpEF, GI bleed, CKD stage III, hypertension, BPH presenting with melena and abnormal labs.  The patient was recently admitted to the hospital from 05/23/2021 to 05/27/2021 secondary to acute anemia.  He presented with a hemoglobin of 5.9.  He was transfused 3.5 units PRBC during that hospital admission.  He underwent EGD and colonoscopy during that hospitalization.  EGD 05/27/21: normal esophagus, normal stomach and duodenum. +Barrett's..  Colonoscopy 05/27/21: anal stricture on DRE.  2 polyps were removed and revealed tubular adenomas. Patient was stabilized and discharged home.  He had a outpatient capsule endoscopy done on 5/26 which showed blood coating in the duodenum/proximal jejunum-and coffee-ground material in the distal small bowel-patient was suspected to have a AVM-the patient had routine blood work done by his PCP on 07/06/2021.  He was contacted on 07/09/2021 with low hemoglobin and told to go to the emergency department for further evaluation.  The patient states that he has been having intermittent melanotic stools for the past month.  He also endorsed exertional dyspnea and ongoing lower extremity edema.  He states that his lower extremity edema is actually better than usual.  However, he states that he has not had any chest pain and states that his breathing has been good the past 3 to 4 days.  He denies any fevers, chills, headache, abdominal pain, diarrhea, hematochezia, melena, nausea, vomiting.  In the ED, the patient was noted to have hemoglobin 7.1.  Serum creatinine was 1.33.  GI was consulted to assist with management.  Notably, the patient had also had a recent admission to [facility] from 06/14/2021 to 06/19/2021 for acute on chronic systolic CHF.  He had urinary retention and a UTI.  He was discharged home with a Foley catheter.  His hemoglobin was 8.0 at the time of discharge on 06/19/2021.

## 2021-07-10 NOTE — Anesthesia Preprocedure Evaluation (Signed)
Anesthesia Evaluation  Patient identified by MRN, date of birth, ID band Patient awake    Reviewed: Allergy & Precautions, H&P , NPO status , Patient's Chart, lab work & pertinent test results, reviewed documented beta blocker date and time   Airway Mallampati: II  TM Distance: >3 FB Neck ROM: full    Dental no notable dental hx.    Pulmonary neg pulmonary ROS, former smoker,    Pulmonary exam normal breath sounds clear to auscultation       Cardiovascular Exercise Tolerance: Good hypertension, +CHF  + dysrhythmias Atrial Fibrillation  Rhythm:regular Rate:Normal     Neuro/Psych negative neurological ROS  negative psych ROS   GI/Hepatic negative GI ROS, Neg liver ROS,   Endo/Other  negative endocrine ROS  Renal/GU negative Renal ROS  negative genitourinary   Musculoskeletal   Abdominal   Peds  Hematology  (+) Blood dyscrasia, anemia ,   Anesthesia Other Findings   Reproductive/Obstetrics negative OB ROS                             Anesthesia Physical  Anesthesia Plan  ASA: 4 and emergent  Anesthesia Plan: General   Post-op Pain Management:    Induction:   PONV Risk Score and Plan: Propofol infusion  Airway Management Planned:   Additional Equipment:   Intra-op Plan:   Post-operative Plan:   Informed Consent: I have reviewed the patients History and Physical, chart, labs and discussed the procedure including the risks, benefits and alternatives for the proposed anesthesia with the patient or authorized representative who has indicated his/her understanding and acceptance.     Dental Advisory Given  Plan Discussed with: CRNA  Anesthesia Plan Comments:         Anesthesia Quick Evaluation

## 2021-07-10 NOTE — Assessment & Plan Note (Signed)
Appears clinically euvolemic presently Continue home dose furosemide 05/24/2021 echo EF 50%, no WMA, moderate pulm hypertension, mild MR, MS, mild decreased RV function

## 2021-07-10 NOTE — Assessment & Plan Note (Signed)
Recent discharge from [facility] with hemoglobin 8.0 Hemoglobin nadir 6.7 Transfused 1 unit PRBC

## 2021-07-10 NOTE — Op Note (Addendum)
[facility] Patient Name: Ricky [name] Procedure Date: 07/10/2021 12:11 PM MRN: [id number] Date of Birth: [birth year] Attending MD: Maylon Peppers ,  CSN: [id number] Age: 86 Admit Type: Inpatient Procedure:                Small bowel enteroscopy Indications:              Melena, abnormal capsule Providers:                Maylon Peppers, Janeece Riggers, RN, Kristine L.                            Risa Grill, Technician Referring MD:              Medicines:                Monitored Anesthesia Care Complications:            No immediate complications. Estimated Blood Loss:     Estimated blood loss: none. Procedure:                Pre-Anesthesia Assessment:                           - Prior to the procedure, a History and Physical                            was performed, and patient medications, allergies                            and sensitivities were reviewed. The patient's                            tolerance of previous anesthesia was reviewed.                           - The risks and benefits of the procedure and the                            sedation options and risks were discussed with the                            patient. All questions were answered and informed                            consent was obtained.                           - ASA Grade Assessment: III - A patient with severe                            systemic disease.                           After obtaining informed consent, the endoscope was                            passed under direct vision. Throughout the  procedure, the patient's blood pressure, pulse, and                            oxygen saturations were monitored continuously. The                            [phone]) scope was introduced through                            the mouth and advanced to the proximal jejunum. The                            small bowel enteroscopy was accomplished without                             difficulty. The patient tolerated the procedure                            well. Scope In: 12:18:35 PM Scope Out: 12:38:33 PM Total Procedure Duration: 0 hours 19 minutes 58 seconds  Findings:      The esophagus was normal.      The stomach was normal.      Four angiodysplastic lesions with stigmata of recent bleeding were found       in the second portion of the duodenum and in the fourth portion of the       duodenum. . there was presence of scant amount of fresh blood in       proximity to these lesions but no ongoing bleeding source was identified       wihen the blood was washed. Coagulation for bleeding prevention using       argon plasma at 0.3 liters/minute and 20 watts was successful.      There was no evidence of significant pathology in the proximal jejunum. Impression:               - Normal esophagus.                           - Normal stomach.                           - Four recently bleeding angiodysplastic lesions in                            the duodenum. Treated with argon plasma coagulation                            (APC).                           - The examined portion of the jejunum was normal.                           - No specimens collected. Moderate Sedation:      Per Anesthesia Care Recommendation:           - Return patient to hospital ward for ongoing care.                           -  Full liquid diet today.                           - Check H/H every day Procedure Code(s):        --- Professional ---                           [phone], Small intestinal endoscopy, enteroscopy                            beyond second portion of duodenum, not including                            ileum; with control of bleeding (eg, injection,                            bipolar cautery, unipolar cautery, laser, heater                            probe, stapler, plasma coagulator) Diagnosis Code(s):        --- Professional ---                            K31.811, Angiodysplasia of stomach and duodenum                            with bleeding                           K92.1, Melena (includes Hematochezia) CPT copyright 2019 [employer]. All rights reserved. The codes documented in this report are preliminary and upon coder review may  be revised to meet current compliance requirements. Maylon Peppers, MD Maylon Peppers,  07/10/2021 12:50:52 PM This report has been signed electronically. Number of Addenda: 0

## 2021-07-10 NOTE — Assessment & Plan Note (Signed)
Baseline creatinine 1.2-1.5 Monitor serial BMP

## 2021-07-10 NOTE — Assessment & Plan Note (Addendum)
Appreciate GI consult--planning EGD with push enteroscopy Continue PPI twice daily Hold aspirin

## 2021-07-10 NOTE — Assessment & Plan Note (Signed)
Continue Alfuzosin °

## 2021-07-10 NOTE — Assessment & Plan Note (Signed)
Holding benazepril Restart carvedilol

## 2021-07-10 NOTE — TOC Transition Note (Signed)
  Transition of Care [facility]) Screening Note   Patient Details  Name: Ricky [name] Date of Birth: December 06, 1935   Transition of Care [facility]) CM/SW Contact:    Leitha Bleak, RN Phone Number: 07/10/2021, 10:55 AM   Transition of Care Department [facility]) has reviewed patient and no TOC needs have been identified at this time. We will continue to monitor patient advancement through interdisciplinary progression rounds. If new patient transition needs arise, please place a TOC consult.    Barriers to Discharge: Continued Medical Work up

## 2021-07-10 NOTE — Transfer of Care (Signed)
Immediate Anesthesia Transfer of Care Note  Patient: Ricky [name]  Procedure(s) Performed: ESOPHAGOGASTRODUODENOSCOPY (EGD) WITH PROPOFOL HOT HEMOSTASIS (ARGON PLASMA COAGULATION/BICAP)  Patient Location: PACU  Anesthesia Type:General  Level of Consciousness: awake  Airway & Oxygen Therapy: Patient Spontanous Breathing  Post-op Assessment: Report given to RN  Post vital signs: Reviewed and stable  Last Vitals:  Vitals Value Taken Time  BP 98/49 07/10/21 1245  Temp    Pulse    Resp 16 07/10/21 1247  SpO2    Vitals shown include unvalidated device data.  Last Pain:  Vitals:   07/10/21 0827  TempSrc: Oral  PainSc: 0-No pain         Complications: No notable events documented.

## 2021-07-10 NOTE — Progress Notes (Signed)
PROGRESS NOTE  Ricky [name] [id number] DOB: [birth year] DOA: 07/09/2021 PCP: Neale Burly, MD  Brief History:   86 y.o. male with medical history significant of HTN, HFpEF, GI bleed, CKD stage III, hypertension, BPH presenting with melena and abnormal labs.  The patient was recently admitted to the hospital from 05/23/2021 to 05/27/2021 secondary to acute anemia.  He presented with a hemoglobin of 5.9.  He was transfused 3.5 units PRBC during that hospital admission.  He underwent EGD and colonoscopy during that hospitalization.  EGD 05/27/21: normal esophagus, normal stomach and duodenum. +Barrett's..  Colonoscopy 05/27/21: anal stricture on DRE.  2 polyps were removed and revealed tubular adenomas. Patient was stabilized and discharged home.  He had a outpatient capsule endoscopy done on 5/26 which showed blood coating in the duodenum/proximal jejunum-and coffee-ground material in the distal small bowel-patient was suspected to have a AVM-the patient had routine blood work done by his PCP on 07/06/2021.  He was contacted on 07/09/2021 with low hemoglobin and told to go to the emergency department for further evaluation.  The patient states that he has been having intermittent melanotic stools for the past month.  He also endorsed exertional dyspnea and ongoing lower extremity edema.  He states that his lower extremity edema is actually better than usual.  However, he states that he has not had any chest pain and states that his breathing has been good the past 3 to 4 days.  He denies any fevers, chills, headache, abdominal pain, diarrhea, hematochezia, melena, nausea, vomiting.  In the ED, the patient was noted to have hemoglobin 7.1.  Serum creatinine was 1.33.  GI was consulted to assist with management.  Notably, the patient had also had a recent admission to [facility] from 06/14/2021 to 06/19/2021 for acute on chronic systolic CHF.  He had urinary retention and a UTI.  He was discharged home with a  Foley catheter.  His hemoglobin was 8.0 at the time of discharge on 06/19/2021.       Assessment and Plan: * GI bleed Appreciate GI consult--planning EGD with push enteroscopy Continue PPI twice daily Hold aspirin   Acute blood loss anemia (ABLA) Recent discharge from [facility] with hemoglobin 8.0 Hemoglobin nadir 6.7 Transfused 1 unit PRBC  Chronic heart failure with preserved ejection fraction (HFpEF) ([place]) Appears clinically euvolemic presently Continue home dose furosemide 05/24/2021 echo EF 50%, no WMA, moderate pulm hypertension, mild MR, MS, mild decreased RV function  Chronic kidney disease, stage 3b (HCC) Baseline creatinine 1.2-1.5 Monitor serial BMP  BPH with obstruction/lower urinary tract symptoms Continue Alfuzosin  HTN (hypertension) Holding benazepril Restart carvedilol       Family Communication:   no Family at bedside  Consultants:  GI  Code Status:  FULL   DVT Prophylaxis:  SCDs   Procedures: As Listed in Progress Note Above  Antibiotics: None       Subjective: The patient has some melanotic stools but none since admission.  He denies any fevers, chills, chest pain, shortness breath, nausea, vomiting, diarrhea, abdominal pain.  Urinate.  Objective: Vitals:   07/09/21 2116 07/10/21 0002 07/10/21 0120 07/10/21 0651  BP: 140/62 (!) 143/54 (!) 141/61 (!) 143/77  Pulse: 80 77 76 73  Resp: 16 16 16 18   Temp: 97.9 F (36.6 C) 97.9 F (36.6 C) (!) 97.5 F (36.4 C) 98 F (36.7 C)  TempSrc: Oral Oral Oral Oral  SpO2: 97% 97% 98% 96%  Weight:  Height:        Intake/Output Summary (Last 24 hours) at 07/10/2021 0713 Last data filed at 07/10/2021 0500 Gross per 24 hour  Intake 128.33 ml  Output 2800 ml  Net -2671.67 ml   Weight change:  Exam:  General:  Pt is alert, follows commands appropriately, not in acute distress HEENT: No icterus, No thrush, No neck mass, [place]/AT Cardiovascular: RRR, S1/S2, no rubs, no gallops Respiratory:  CTA bilaterally, no wheezing, no crackles, no rhonchi Abdomen: Soft/+BS, non tender, non distended, no guarding Extremities: 1 + LE edema, No lymphangitis, No petechiae, No rashes, no synovitis   Data Reviewed: I have personally reviewed following labs and imaging studies Basic Metabolic Panel: Recent Labs  Lab 07/09/21 1534 07/10/21 0420  NA 139 141  K 3.8 3.6  CL 110 109  CO2 24 28  GLUCOSE 104* 88  BUN 13 13  CREATININE 1.33* 1.13  CALCIUM 7.7* 7.9*   Liver Function Tests: No results for input(s): AST, ALT, ALKPHOS, BILITOT, PROT, ALBUMIN in the last 168 hours. No results for input(s): LIPASE, AMYLASE in the last 168 hours. No results for input(s): AMMONIA in the last 168 hours. Coagulation Profile: Recent Labs  Lab 07/09/21 1626  INR 1.2   CBC: Recent Labs  Lab 07/09/21 1534 07/09/21 2017 07/10/21 0420  WBC 4.3 4.2 4.4  NEUTROABS 3.0  --   --   HGB 7.1* 6.7* 7.8*  HCT 25.1* 23.8* 26.7*  MCV 87.2 87.2 85.6  PLT 398 348 367   Cardiac Enzymes: No results for input(s): CKTOTAL, CKMB, CKMBINDEX, TROPONINI in the last 168 hours. BNP: Invalid input(s): POCBNP CBG: No results for input(s): GLUCAP in the last 168 hours. HbA1C: No results for input(s): HGBA1C in the last 72 hours. Urine analysis:    Component Value Date/Time   COLORURINE STRAW (A) 05/24/2021 0205   APPEARANCEUR Clear 07/06/2021 1123   LABSPEC 1.005 05/24/2021 0205   PHURINE 6.0 05/24/2021 0205   GLUCOSEU Negative 07/06/2021 1123   HGBUR LARGE (A) 05/24/2021 0205   BILIRUBINUR Negative 07/06/2021 1123   KETONESUR NEGATIVE 05/24/2021 0205   PROTEINUR Negative 07/06/2021 1123   PROTEINUR NEGATIVE 05/24/2021 0205   NITRITE Negative 07/06/2021 1123   NITRITE NEGATIVE 05/24/2021 0205   LEUKOCYTESUR Negative 07/06/2021 1123   LEUKOCYTESUR NEGATIVE 05/24/2021 0205   Sepsis Labs: @LABRCNTIP (procalcitonin:4,lacticidven:4) )No results found for this or any previous visit (from the past 240  hour(s)).   Scheduled Meds:  alfuzosin  10 mg Oral Daily   pantoprazole (PROTONIX) IV  40 mg Intravenous Q12H   sodium chloride flush  3 mL Intravenous Q12H   Continuous Infusions:  sodium chloride      Procedures/Studies: No results found.  Orson Eva, DO  [facility]  If 7PM-7AM, please contact night-coverage [email or web address] Password [email or web address] 07/10/2021, 7:13 AM   LOS: 1 day

## 2021-07-11 DIAGNOSIS — D649 Anemia, unspecified: Secondary | ICD-10-CM | POA: Diagnosis not present

## 2021-07-11 DIAGNOSIS — N1832 Chronic kidney disease, stage 3b: Secondary | ICD-10-CM | POA: Diagnosis not present

## 2021-07-11 DIAGNOSIS — K922 Gastrointestinal hemorrhage, unspecified: Secondary | ICD-10-CM | POA: Diagnosis not present

## 2021-07-11 DIAGNOSIS — I5032 Chronic diastolic (congestive) heart failure: Secondary | ICD-10-CM | POA: Diagnosis not present

## 2021-07-11 DIAGNOSIS — K5900 Constipation, unspecified: Secondary | ICD-10-CM | POA: Diagnosis not present

## 2021-07-11 DIAGNOSIS — D62 Acute posthemorrhagic anemia: Secondary | ICD-10-CM | POA: Diagnosis not present

## 2021-07-11 LAB — CBC
HCT: 27.8 % — ABNORMAL LOW (ref 39.0–52.0)
HCT: 30.5 % — ABNORMAL LOW (ref 39.0–52.0)
Hemoglobin: 7.9 g/dL — ABNORMAL LOW (ref 13.0–17.0)
Hemoglobin: 8.9 g/dL — ABNORMAL LOW (ref 13.0–17.0)
MCH: 24.9 pg — ABNORMAL LOW (ref 26.0–34.0)
MCH: 25.4 pg — ABNORMAL LOW (ref 26.0–34.0)
MCHC: 28.4 g/dL — ABNORMAL LOW (ref 30.0–36.0)
MCHC: 29.2 g/dL — ABNORMAL LOW (ref 30.0–36.0)
MCV: 87.7 fL (ref 80.0–100.0)
MCV: 86.9 fL (ref 80.0–100.0)
Platelets: 305 10*3/uL (ref 150–400)
Platelets: 325 10*3/uL (ref 150–400)
RBC: 3.17 MIL/uL — ABNORMAL LOW (ref 4.22–5.81)
RBC: 3.51 MIL/uL — ABNORMAL LOW (ref 4.22–5.81)
RDW: 22.2 % — ABNORMAL HIGH (ref 11.5–15.5)
RDW: 22.5 % — ABNORMAL HIGH (ref 11.5–15.5)
WBC: 4.1 10*3/uL (ref 4.0–10.5)
WBC: 4.3 10*3/uL (ref 4.0–10.5)
nRBC: 0 % (ref 0.0–0.2)
nRBC: 0 % (ref 0.0–0.2)

## 2021-07-11 LAB — BASIC METABOLIC PANEL
Anion gap: 6 (ref 5–15)
BUN: 15 mg/dL (ref 8–23)
CO2: 26 mmol/L (ref 22–32)
Calcium: 8.1 mg/dL — ABNORMAL LOW (ref 8.9–10.3)
Chloride: 108 mmol/L (ref 98–111)
Creatinine, Ser: 0.93 mg/dL (ref 0.61–1.24)
GFR, Estimated: >60 mL/min (ref >60)
Glucose, Bld: 87 mg/dL (ref 70–99)
Potassium: 3.7 mmol/L (ref 3.5–5.1)
Sodium: 140 mmol/L (ref 135–145)

## 2021-07-11 MED ORDER — POLYETHYLENE GLYCOL 3350 17 G PO PACK
17.0000 g | PACK | Freq: Every day | ORAL | Status: DC
  Administered 2021-07-11: 17 g via ORAL
  Filled 2021-07-11: qty 1

## 2021-07-11 MED ORDER — BISACODYL 5 MG PO TBEC
10.0000 mg | DELAYED_RELEASE_TABLET | Freq: Once | ORAL | Status: AC
  Administered 2021-07-11: 10 mg via ORAL
  Filled 2021-07-11: qty 2

## 2021-07-11 MED ORDER — PANTOPRAZOLE SODIUM 40 MG PO TBEC: 40.0000 mg | DELAYED_RELEASE_TABLET | Freq: Two times a day (BID) | ORAL | 1 refills | Status: AC

## 2021-07-11 NOTE — Discharge Summary (Signed)
Physician Discharge Summary  Ricky [name] [id number] DOB: Jun 03, 1935 DOA: 07/09/2021  PCP: Neale Burly, MD  Admit date: 07/09/2021 Discharge date: 07/11/2021  Admitted From: Home Disposition: Home  Recommendations for Outpatient Follow-up:  Follow up with PCP in 1-2 weeks Please obtain BMP/CBC in one week Follow-up with GI  Home Health: Equipment/Devices:  Discharge Condition: Stable CODE STATUS: Full code Diet recommendation: Heart healthy  Brief/Interim Summary: 86 y.o. male with medical history significant of HTN, HFpEF, GI bleed, CKD stage III, hypertension, BPH presenting with melena and abnormal labs.  The patient was recently admitted to the hospital from 05/23/2021 to 05/27/2021 secondary to acute anemia.  He presented with a hemoglobin of 5.9.  He was transfused 3.5 units PRBC during that hospital admission.  He underwent EGD and colonoscopy during that hospitalization.  EGD 05/27/21: normal esophagus, normal stomach and duodenum. +Barrett's..  Colonoscopy 05/27/21: anal stricture on DRE.  2 polyps were removed and revealed tubular adenomas. Patient was stabilized and discharged home.  He had a outpatient capsule endoscopy done on 5/26 which showed blood coating in the duodenum/proximal jejunum-and coffee-ground material in the distal small bowel-patient was suspected to have a AVM-the patient had routine blood work done by his PCP on 07/06/2021.  He was contacted on 07/09/2021 with low hemoglobin and told to go to the emergency department for further evaluation.  The patient states that he has been having intermittent melanotic stools for the past month.  He also endorsed exertional dyspnea and ongoing lower extremity edema.  He states that his lower extremity edema is actually better than usual.  However, he states that he has not had any chest pain and states that his breathing has been good the past 3 to 4 days.  He denies any fevers, chills, headache, abdominal pain, diarrhea,  hematochezia, melena, nausea, vomiting.  In the ED, the patient was noted to have hemoglobin 7.1.  Serum creatinine was 1.33.  GI was consulted to assist with management.  Discharge Diagnoses:  Principal Problem:   GI bleed Active Problems:   Acute blood loss anemia (ABLA)   Chronic heart failure with preserved ejection fraction (HFpEF) (HCC)   HTN (hypertension)   Chronic kidney disease, stage 3b (HCC)   Symptomatic anemia   Constipation  GI bleed Seen by GI and underwent EGD that showed for recently bleeding angiodysplastic lesions in the duodenum.  This was treated with APC Continue PPI twice daily Okay to resume aspirin on discharge     Acute blood loss anemia (ABLA) Recent discharge from [facility] with hemoglobin 8.0 Hemoglobin nadir 6.7 Transfused 1 unit PRBC -Follow-up hemoglobin has been stable, currently 8.9   Chronic heart failure with preserved ejection fraction (HFpEF) ([place]) Appears clinically euvolemic presently Continue home dose furosemide 05/24/2021 echo EF 50%, no WMA, moderate pulm hypertension, mild MR, MS, mild decreased RV function   Chronic kidney disease, stage 3b (HCC) Baseline creatinine 1.2-1.5 Monitor serial BMP   BPH with obstruction/lower urinary tract symptoms Continue Alfuzosin   HTN (hypertension) Resume benazepril Restart carvedilol  Discharge Instructions  Discharge Instructions     Diet - low sodium heart healthy   Complete by: As directed    Increase activity slowly   Complete by: As directed       Allergies as of 07/11/2021       Reactions   Penicillins         Medication List     TAKE these medications    alfuzosin 10 MG 24 hr  tablet Commonly known as: UROXATRAL Take 10 mg by mouth daily.   ALLERGY PO Take 10 mg by mouth daily.   aspirin EC 81 MG tablet Take 81 mg by mouth daily. Swallow whole.   benazepril 5 MG tablet Commonly known as: LOTENSIN Take 1 tablet (5 mg total) by mouth daily.   carvedilol 6.25 MG  tablet Commonly known as: COREG Take 1 tablet (6.25 mg total) by mouth 2 (two) times daily with a meal.   ferrous sulfate 325 (65 FE) MG EC tablet Take 1 tablet (325 mg total) by mouth 2 (two) times daily.   folic acid 1 MG tablet Commonly known as: FOLVITE Take 1 tablet (1 mg total) by mouth daily.   furosemide 20 MG tablet Commonly known as: LASIX Take 20 mg by mouth daily.   pantoprazole 40 MG tablet Commonly known as: Protonix Take 1 tablet (40 mg total) by mouth 2 (two) times daily before a meal.   potassium chloride 10 MEQ tablet Commonly known as: KLOR-CON Take 10 mEq by mouth daily.   vitamin B-12 250 MCG tablet Commonly known as: CYANOCOBALAMIN Take 1 tablet (250 mcg total) by mouth daily.        Allergies  Allergen Reactions   Penicillins     Consultations: Gastroenterology   Procedures/Studies: No results found.    Subjective: Feels well today.  No new complaints.  Discharge Exam: Vitals:   07/10/21 1300 07/10/21 2035 07/11/21 0431 07/11/21 1430  BP: (!) 120/49 136/60 (!) 139/57 130/61  Pulse:  72 70 69  Resp: 16 18 18 17   Temp:  98.5 F (36.9 C) 98.7 F (37.1 C) 97.8 F (36.6 C)  TempSrc:      SpO2:  100% 95% 98%  Weight:      Height:        General: Pt is alert, awake, not in acute distress Cardiovascular: RRR, S1/S2 +, no rubs, no gallops Respiratory: CTA bilaterally, no wheezing, no rhonchi Abdominal: Soft, NT, ND, bowel sounds + Extremities: no edema, no cyanosis    The results of significant diagnostics from this hospitalization (including imaging, microbiology, ancillary and laboratory) are listed below for reference.     Microbiology: No results found for this or any previous visit (from the past 240 hour(s)).   Labs: BNP (last 3 results) Recent Labs    05/23/21 1440 05/26/21 0434  BNP 1,134.0* 123456*   Basic Metabolic Panel: Recent Labs  Lab 07/09/21 1534 07/10/21 0420 07/11/21 0432  NA 139 141 140  K 3.8  3.6 3.7  CL 110 109 108  CO2 24 28 26   GLUCOSE 104* 88 87  BUN 13 13 15   CREATININE 1.33* 1.13 0.93  CALCIUM 7.7* 7.9* 8.1*   Liver Function Tests: No results for input(s): AST, ALT, ALKPHOS, BILITOT, PROT, ALBUMIN in the last 168 hours. No results for input(s): LIPASE, AMYLASE in the last 168 hours. No results for input(s): AMMONIA in the last 168 hours. CBC: Recent Labs  Lab 07/09/21 1534 07/09/21 2017 07/10/21 0420 07/10/21 0759 07/10/21 1955 07/11/21 0432 07/11/21 0825  WBC 4.3   < > 4.4 4.4 4.4 4.1 4.3  NEUTROABS 3.0  --   --   --   --   --   --   HGB 7.1*   < > 7.8* 7.9* 7.6* 7.9* 8.9*  HCT 25.1*   < > 26.7* 27.1* 26.1* 27.8* 30.5*  MCV 87.2   < > 85.6 85.2 85.3 87.7 86.9  PLT 398   < >  367 353 328 305 325   < > = values in this interval not displayed.   Cardiac Enzymes: No results for input(s): CKTOTAL, CKMB, CKMBINDEX, TROPONINI in the last 168 hours. BNP: Invalid input(s): POCBNP CBG: No results for input(s): GLUCAP in the last 168 hours. D-Dimer No results for input(s): DDIMER in the last 72 hours. Hgb A1c No results for input(s): HGBA1C in the last 72 hours. Lipid Profile No results for input(s): CHOL, HDL, LDLCALC, TRIG, CHOLHDL, LDLDIRECT in the last 72 hours. Thyroid function studies No results for input(s): TSH, T4TOTAL, T3FREE, THYROIDAB in the last 72 hours.  Invalid input(s): FREET3 Anemia work up No results for input(s): VITAMINB12, FOLATE, FERRITIN, TIBC, IRON, RETICCTPCT in the last 72 hours. Urinalysis    Component Value Date/Time   COLORURINE STRAW (A) 05/24/2021 0205   APPEARANCEUR Clear 07/06/2021 1123   LABSPEC 1.005 05/24/2021 0205   PHURINE 6.0 05/24/2021 0205   GLUCOSEU Negative 07/06/2021 1123   HGBUR LARGE (A) 05/24/2021 0205   BILIRUBINUR Negative 07/06/2021 1123   KETONESUR NEGATIVE 05/24/2021 0205   PROTEINUR Negative 07/06/2021 1123   PROTEINUR NEGATIVE 05/24/2021 0205   NITRITE Negative 07/06/2021 1123   NITRITE  NEGATIVE 05/24/2021 0205   LEUKOCYTESUR Negative 07/06/2021 1123   LEUKOCYTESUR NEGATIVE 05/24/2021 0205   Sepsis Labs Invalid input(s): PROCALCITONIN,  WBC,  LACTICIDVEN Microbiology No results found for this or any previous visit (from the past 240 hour(s)).   Time coordinating discharge: 21mins  SIGNED:   Kathie Dike, MD  [facility] 07/11/2021, 8:12 PM   If 7PM-7AM, please contact night-coverage [email or web address]

## 2021-07-11 NOTE — Progress Notes (Signed)
Subjective: Patient states he is feeling pretty good this morning. States that he needs to have a BM, last BM was on Monday, he attempted earlier but was not able to go. Denies nausea or vomiting. States he eats prunes at home and takes an ex lax PRN. States he is hungry.   Objective: Vital signs in last 24 hours: Temp:  [98.1 F (36.7 C)-98.7 F (37.1 C)] 98.7 F (37.1 C) (06/07 0431) Pulse Rate:  [70-72] 70 (06/07 0431) Resp:  [16-20] 18 (06/07 0431) BP: (98-139)/(49-60) 139/57 (06/07 0431) SpO2:  [95 %-100 %] 95 % (06/07 0431) Last BM Date : 07/10/21 General:   Alert and oriented, pleasant Head:  Normocephalic and atraumatic. Eyes:  No icterus, sclera clear. Conjuctiva pink.  Mouth:  Without lesions, mucosa pink and moist.  Heart:  S1, S2 present, no murmurs noted.  Lungs: Clear to auscultation bilaterally, without wheezing, rales, or rhonchi.  Abdomen:  Bowel sounds present, soft, non-tender, non-distended. No HSM or hernias noted. No rebound or guarding. No masses appreciated  Msk:  Symmetrical without gross deformities. Normal posture. Pulses:  Normal pulses noted. Extremities:  Without clubbing or edema. Neurologic:  Alert and  oriented x4;  grossly normal neurologically. Skin:  Warm and dry, intact without significant lesions.  Psych:  Alert and cooperative. Normal mood and affect.  Intake/Output from previous day: 06/06 0701 - 06/07 0700 In: 1060 [P.O.:560; I.V.:500] Out: 1050 [Urine:1050]   Lab Results: Recent Labs    07/10/21 1955 07/11/21 0432 07/11/21 0825  WBC 4.4 4.1 4.3  HGB 7.6* 7.9* 8.9*  HCT 26.1* 27.8* 30.5*  PLT 328 305 325   BMET Recent Labs    07/09/21 1534 07/10/21 0420 07/11/21 0432  NA 139 141 140  K 3.8 3.6 3.7  CL 110 109 108  CO2 24 28 26   GLUCOSE 104* 88 87  BUN 13 13 15   CREATININE 1.33* 1.13 0.93  CALCIUM 7.7* 7.9* 8.1*   LFT No results for input(s): PROT, ALBUMIN, AST, ALT, ALKPHOS, BILITOT, BILIDIR, IBILI in the last 72  hours. PT/INR Recent Labs    07/09/21 1626  LABPROT 14.9  INR 1.2    Assessment: Ricky [name] is an 86 year old male with history of profound anemia in April 2023 requiring admission, with Hgb 4.9 at that time, receiving transfusions and undergoing both EGD/colonoscopy that were unrevealing. Capsule study as outpatient with small amount of blood in duodenum, possible proximal jejunum, with suspected AVM or Dieulafoy lesion as culprit. Presenting now with acute on chronic anemia and reports of melena.  Acute blood loss anemia: hgb 7.1 on Friday with PCP, up to 8.9 today. No reported use of NSAIDs or anticoagulants other than 81mg  ASA daily. Reported melena a few days prior to arrival. 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 Scant amount of fresh blood in proximity to these but no ongoing bleeding source identified. Lesions treated with APC. Patient feeling good today. Will advance to soft diet.   Constipation: chronic. last BM was 2 days ago, eats prunes and takes ex lax PRN for constipation at home. Will start miralax 17g, can increase to BID if no results.  Plan: Will advance to soft diet Continue PPI BID Trend h&h daily, transfuse for hgb <7 Monitor for overt GI bleeding Avoid NSAIDs  Start Miralax 17g daily   LOS: 2 days    07/11/2021, 9:18 AM  Sussan Meter L. Alver Sorrow, MSN, APRN, AGNP-C Adult-Gerontology Nurse Practitioner [facility]

## 2021-07-11 NOTE — Care Management Important Message (Signed)
Important Message  Patient Details  Name: Ricky [name] MRN: [id number] Date of Birth: 1935-04-27   Medicare Important Message Given:  N/A - LOS <3 / Initial given by admissions     Corey Harold 07/11/2021, 12:03 PM

## 2021-07-16 ENCOUNTER — Encounter (HOSPITAL_COMMUNITY): Payer: Self-pay | Admitting: Gastroenterology

## 2021-07-18 DIAGNOSIS — Z6824 Body mass index (BMI) 24.0-24.9, adult: Secondary | ICD-10-CM | POA: Diagnosis not present

## 2021-07-18 DIAGNOSIS — R339 Retention of urine, unspecified: Secondary | ICD-10-CM | POA: Diagnosis not present

## 2021-07-18 DIAGNOSIS — K921 Melena: Secondary | ICD-10-CM | POA: Diagnosis not present

## 2021-07-31 DIAGNOSIS — I5032 Chronic diastolic (congestive) heart failure: Secondary | ICD-10-CM | POA: Diagnosis not present

## 2021-07-31 DIAGNOSIS — I10 Essential (primary) hypertension: Secondary | ICD-10-CM | POA: Diagnosis not present

## 2021-07-31 DIAGNOSIS — Z6824 Body mass index (BMI) 24.0-24.9, adult: Secondary | ICD-10-CM | POA: Diagnosis not present

## 2021-07-31 DIAGNOSIS — D508 Other iron deficiency anemias: Secondary | ICD-10-CM | POA: Diagnosis not present

## 2021-07-31 DIAGNOSIS — Z0000 Encounter for general adult medical examination without abnormal findings: Secondary | ICD-10-CM | POA: Diagnosis not present

## 2021-08-15 ENCOUNTER — Ambulatory Visit: Payer: Medicare PPO | Admitting: Urology

## 2021-11-06 DIAGNOSIS — Z0000 Encounter for general adult medical examination without abnormal findings: Secondary | ICD-10-CM | POA: Diagnosis not present

## 2021-11-06 DIAGNOSIS — Z6825 Body mass index (BMI) 25.0-25.9, adult: Secondary | ICD-10-CM | POA: Diagnosis not present

## 2021-11-06 DIAGNOSIS — I5032 Chronic diastolic (congestive) heart failure: Secondary | ICD-10-CM | POA: Diagnosis not present

## 2021-11-06 DIAGNOSIS — I10 Essential (primary) hypertension: Secondary | ICD-10-CM | POA: Diagnosis not present

## 2021-11-06 DIAGNOSIS — D508 Other iron deficiency anemias: Secondary | ICD-10-CM | POA: Diagnosis not present

## 2022-02-07 DIAGNOSIS — Z6825 Body mass index (BMI) 25.0-25.9, adult: Secondary | ICD-10-CM | POA: Diagnosis not present

## 2022-02-07 DIAGNOSIS — I5032 Chronic diastolic (congestive) heart failure: Secondary | ICD-10-CM | POA: Diagnosis not present

## 2022-02-07 DIAGNOSIS — I10 Essential (primary) hypertension: Secondary | ICD-10-CM | POA: Diagnosis not present

## 2022-02-07 DIAGNOSIS — Z0000 Encounter for general adult medical examination without abnormal findings: Secondary | ICD-10-CM | POA: Diagnosis not present

## 2022-02-07 DIAGNOSIS — K219 Gastro-esophageal reflux disease without esophagitis: Secondary | ICD-10-CM | POA: Diagnosis not present

## 2022-02-07 DIAGNOSIS — D508 Other iron deficiency anemias: Secondary | ICD-10-CM | POA: Diagnosis not present

## 2022-06-13 DIAGNOSIS — I5032 Chronic diastolic (congestive) heart failure: Secondary | ICD-10-CM | POA: Diagnosis not present

## 2022-06-13 DIAGNOSIS — N1832 Chronic kidney disease, stage 3b: Secondary | ICD-10-CM | POA: Diagnosis not present

## 2022-06-13 DIAGNOSIS — Z6825 Body mass index (BMI) 25.0-25.9, adult: Secondary | ICD-10-CM | POA: Diagnosis not present

## 2022-06-13 DIAGNOSIS — Z0000 Encounter for general adult medical examination without abnormal findings: Secondary | ICD-10-CM | POA: Diagnosis not present

## 2022-06-13 DIAGNOSIS — D508 Other iron deficiency anemias: Secondary | ICD-10-CM | POA: Diagnosis not present

## 2022-06-13 DIAGNOSIS — I10 Essential (primary) hypertension: Secondary | ICD-10-CM | POA: Diagnosis not present

## 2022-06-13 DIAGNOSIS — K219 Gastro-esophageal reflux disease without esophagitis: Secondary | ICD-10-CM | POA: Diagnosis not present

## 2022-06-14 IMAGING — DX DG CHEST 1V PORT
1 series · 1 of 1 positions shown · non-contrast
Comparison: None.

CLINICAL DATA: Short of breath, dyspnea on exertion

EXAM:
PORTABLE CHEST 1 VIEW

[chest ap]
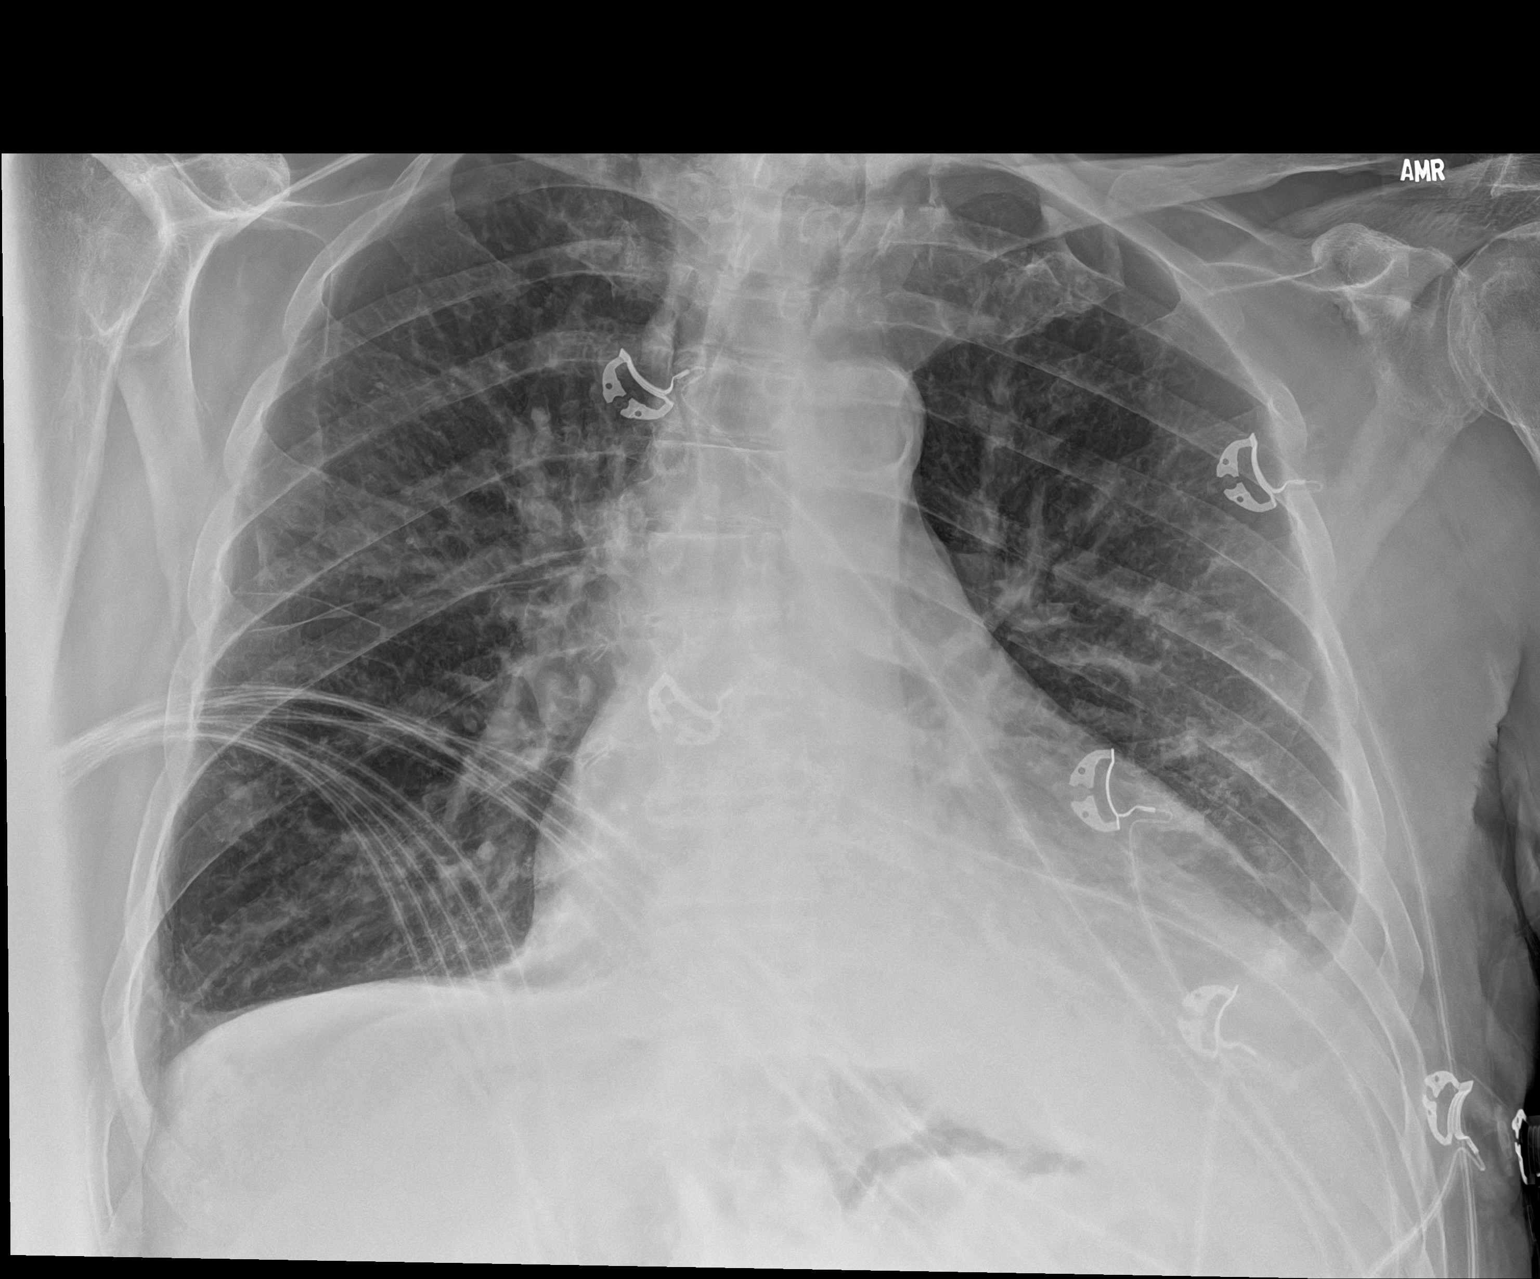

[1 of 1 positions shown; findings below may reference images not displayed]

FINDINGS: Single frontal view of the chest demonstrates mild enlargement the
cardiac silhouette. Veiling opacity left lung base consistent with
consolidation and/or small effusion. Central vascular congestion. No
pneumothorax.
IMPRESSION: 1. Left basilar consolidation and effusion, which could reflect
pneumonia or atelectasis.
2. Central vascular congestion.
3. Borderline enlarged cardiac silhouette.

## 2022-06-15 IMAGING — DX DG CHEST 1V PORT
1 series · 1 of 1 positions shown · non-contrast
Comparison: 05/23/2021

CLINICAL DATA: Acute congestive heart failure

EXAM:
PORTABLE CHEST 1 VIEW

[chest ap]
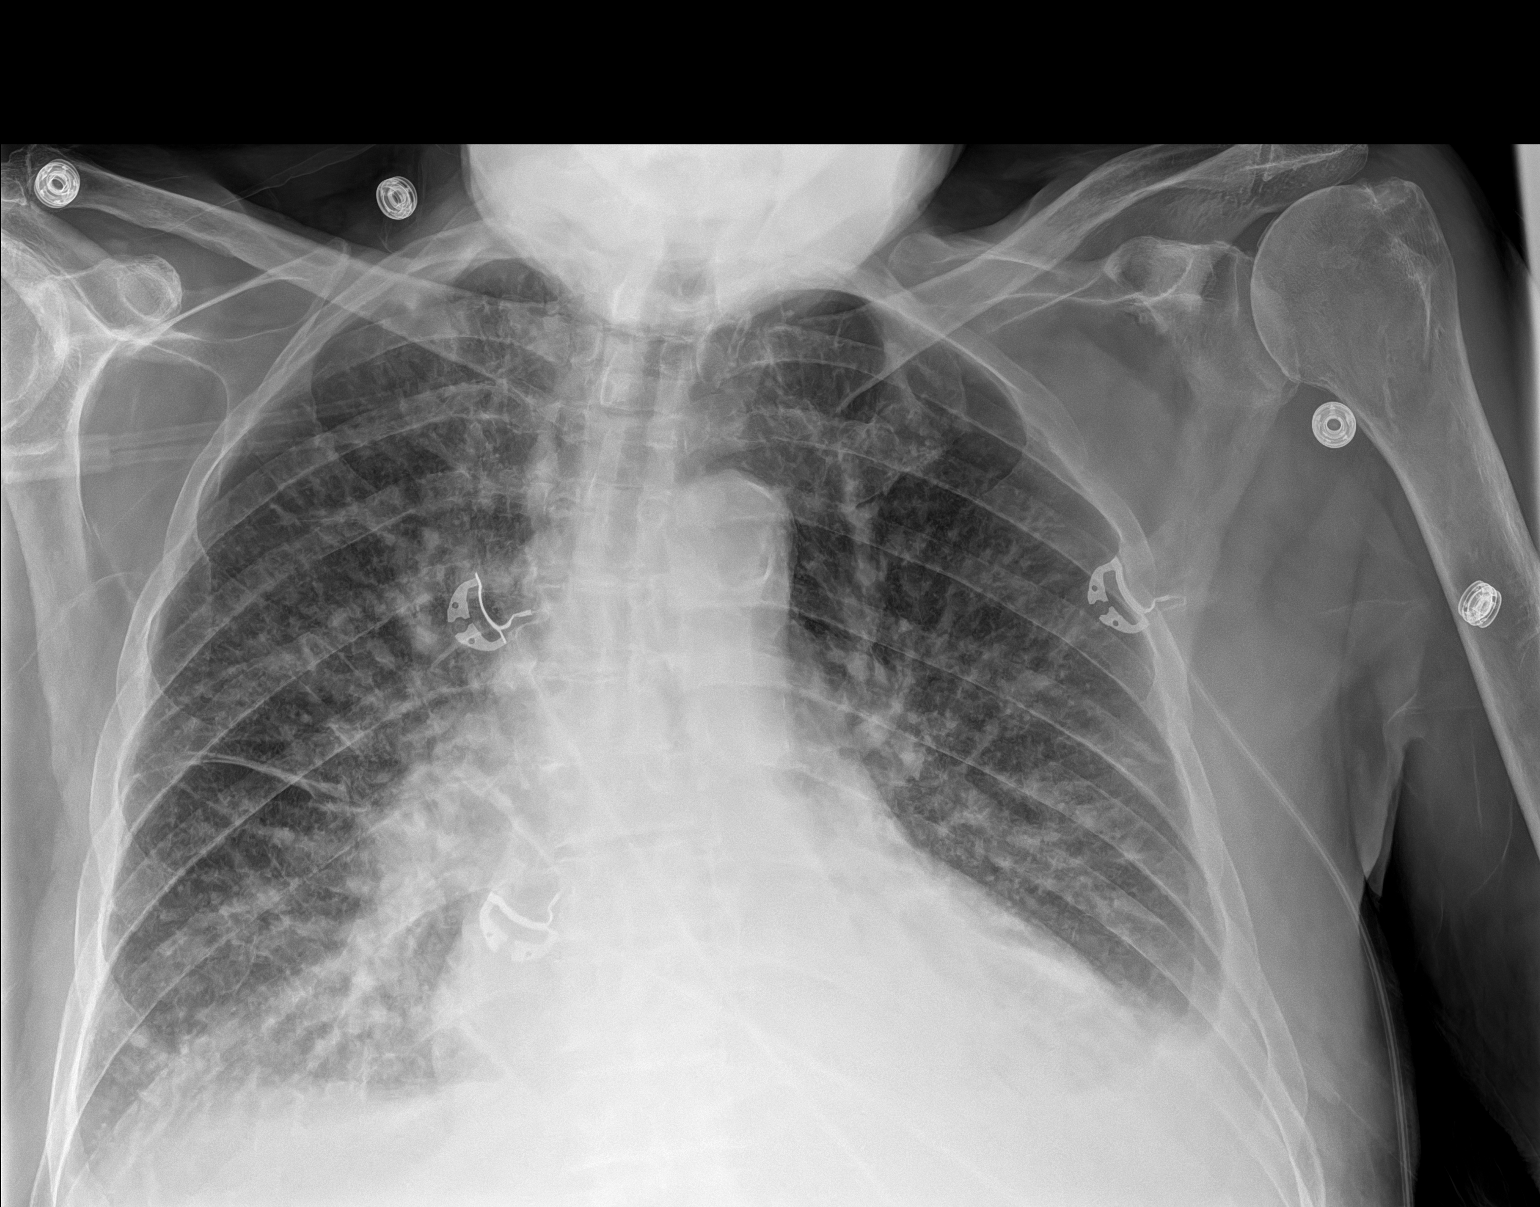

[1 of 1 positions shown; findings below may reference images not displayed]

FINDINGS: Cardiac size is mildly enlarged. Moderate perihilar interstitial
pulmonary infiltrate has progressed and small bilateral pleural
effusions have now developed in keeping with changes of progressive
cardiogenic failure. No pneumothorax. No acute bone abnormality.
IMPRESSION: Progressive, moderate cardiogenic failure with increasing perihilar
interstitial pulmonary edema and development of small bilateral
pleural effusions.

## 2022-08-29 ENCOUNTER — Telehealth: Payer: Self-pay | Admitting: *Deleted

## 2022-08-29 NOTE — Progress Notes (Signed)
  Care Coordination  Outreach Note  08/29/2022 Name: Ricky [name] MRN: [id number] DOB: April 16, 1935   Care Coordination Outreach Attempts: An unsuccessful telephone outreach was attempted today to offer the patient information about available care coordination services.  Follow Up Plan:  Additional outreach attempts will be made to offer the patient care coordination information and services.   Encounter Outcome:  No Answer  Christie Nottingham  Care Coordination Care Guide  Direct Dial: [phone]

## 2022-08-29 NOTE — Progress Notes (Signed)
  Care Coordination   Note   08/29/2022 Name: Ricky [name] MRN: [id number] DOB: 09-04-1935  Ricky [name] is a 87 y.o. year old male who sees Hasanaj, Myra Gianotti, MD for primary care. I reached out to Mickel Crow by phone today to offer care coordination services.  Mr. Afonso was given information about Care Coordination services today including:   The Care Coordination services include support from the care team which includes your Nurse Coordinator, Clinical Social Worker, or Pharmacist.  The Care Coordination team is here to help remove barriers to the health concerns and goals most important to you. Care Coordination services are voluntary, and the patient may decline or stop services at any time by request to their care team member.   Care Coordination Consent Status: Patient daughter in law Ricky [name] DPR on file agreed to services and verbal consent obtained.   Follow up plan:  Telephone appointment with care coordination team member scheduled for:  8/14  Encounter Outcome:  Pt. Scheduled  [facility] Coordination Care Guide  Direct Dial: [phone]

## 2022-09-18 ENCOUNTER — Ambulatory Visit: Payer: Self-pay | Admitting: *Deleted

## 2022-09-18 NOTE — Patient Outreach (Signed)
  Care Coordination   09/18/2022 Name: Ricky [name] MRN: [id number] DOB: 09-Mar-1935   Care Coordination Outreach Attempts:  An unsuccessful telephone outreach was attempted for a scheduled appointment today.  Follow Up Plan:  Additional outreach attempts will be made to offer the patient care coordination information and services.   Encounter Outcome:  No Answer. Left HIPAA compliant VM.   Care Coordination Interventions:  No, not indicated    Demetrios Loll, BSN, RN-BC RN Care Coordinator [facility]  [facility] Direct Dial: [phone] Main #: [phone]

## 2022-10-03 DIAGNOSIS — I10 Essential (primary) hypertension: Secondary | ICD-10-CM | POA: Diagnosis not present

## 2022-10-03 DIAGNOSIS — Z0000 Encounter for general adult medical examination without abnormal findings: Secondary | ICD-10-CM | POA: Diagnosis not present

## 2022-10-03 DIAGNOSIS — I5032 Chronic diastolic (congestive) heart failure: Secondary | ICD-10-CM | POA: Diagnosis not present

## 2022-10-03 DIAGNOSIS — D508 Other iron deficiency anemias: Secondary | ICD-10-CM | POA: Diagnosis not present

## 2022-10-03 DIAGNOSIS — Z6825 Body mass index (BMI) 25.0-25.9, adult: Secondary | ICD-10-CM | POA: Diagnosis not present

## 2022-10-03 DIAGNOSIS — K219 Gastro-esophageal reflux disease without esophagitis: Secondary | ICD-10-CM | POA: Diagnosis not present

## 2022-10-03 DIAGNOSIS — N1832 Chronic kidney disease, stage 3b: Secondary | ICD-10-CM | POA: Diagnosis not present

## 2022-10-18 DIAGNOSIS — D508 Other iron deficiency anemias: Secondary | ICD-10-CM | POA: Diagnosis not present

## 2023-01-06 DIAGNOSIS — I5032 Chronic diastolic (congestive) heart failure: Secondary | ICD-10-CM | POA: Diagnosis not present

## 2023-01-06 DIAGNOSIS — N182 Chronic kidney disease, stage 2 (mild): Secondary | ICD-10-CM | POA: Diagnosis not present

## 2023-01-06 DIAGNOSIS — K219 Gastro-esophageal reflux disease without esophagitis: Secondary | ICD-10-CM | POA: Diagnosis not present

## 2023-01-06 DIAGNOSIS — I10 Essential (primary) hypertension: Secondary | ICD-10-CM | POA: Diagnosis not present

## 2023-01-06 DIAGNOSIS — D508 Other iron deficiency anemias: Secondary | ICD-10-CM | POA: Diagnosis not present

## 2023-01-06 DIAGNOSIS — Z6826 Body mass index (BMI) 26.0-26.9, adult: Secondary | ICD-10-CM | POA: Diagnosis not present
